# Patient Record
Sex: Male | Born: 1975 | Race: White | Hispanic: No | Marital: Married | Smoking: Current every day smoker
Health system: Southern US, Community
[De-identification: ages and names within clinical notes are randomized; demographics above are authoritative.]

## PROBLEM LIST (undated history)

## (undated) DIAGNOSIS — R0789 Other chest pain: Secondary | ICD-10-CM

## (undated) DIAGNOSIS — K4090 Unilateral inguinal hernia, without obstruction or gangrene, not specified as recurrent: Secondary | ICD-10-CM

## (undated) DIAGNOSIS — Z720 Tobacco use: Secondary | ICD-10-CM

## (undated) DIAGNOSIS — Z98811 Dental restoration status: Secondary | ICD-10-CM

## (undated) HISTORY — DX: Other chest pain: R07.89

## (undated) HISTORY — PX: WISDOM TOOTH EXTRACTION: SHX21

## (undated) HISTORY — DX: Tobacco use: Z72.0

---

## 2009-02-12 HISTORY — PX: HERNIA REPAIR: SHX51

## 2011-11-14 ENCOUNTER — Emergency Department (HOSPITAL_COMMUNITY)
Admission: EM | Admit: 2011-11-14 | Discharge: 2011-11-14 | Disposition: A | Discharge status: Home / Self Care | Payer: BC Managed Care – PPO | Attending: Emergency Medicine | Admitting: Emergency Medicine

## 2011-11-14 ENCOUNTER — Emergency Department (HOSPITAL_COMMUNITY): Payer: BC Managed Care – PPO

## 2011-11-14 ENCOUNTER — Encounter (HOSPITAL_COMMUNITY): Payer: Self-pay | Admitting: *Deleted

## 2011-11-14 DIAGNOSIS — F17200 Nicotine dependence, unspecified, uncomplicated: Secondary | ICD-10-CM | POA: Insufficient documentation

## 2011-11-14 DIAGNOSIS — K4090 Unilateral inguinal hernia, without obstruction or gangrene, not specified as recurrent: Secondary | ICD-10-CM | POA: Insufficient documentation

## 2011-11-14 DIAGNOSIS — R109 Unspecified abdominal pain: Secondary | ICD-10-CM | POA: Insufficient documentation

## 2011-11-14 LAB — COMPREHENSIVE METABOLIC PANEL
ALT: 36 U/L (ref 0–53)
AST: 24 U/L (ref 0–37)
Albumin: 4.2 g/dL (ref 3.5–5.2)
CO2: 26 mEq/L (ref 19–32)
Calcium: 9.4 mg/dL (ref 8.4–10.5)
Sodium: 140 mEq/L (ref 135–145)
Total Protein: 7.7 g/dL (ref 6.0–8.3)

## 2011-11-14 LAB — DIFFERENTIAL
Basophils Absolute: 0 10*3/uL (ref 0.0–0.1)
Basophils Relative: 1 % (ref 0–1)
Eosinophils Absolute: 0.5 10*3/uL (ref 0.0–0.7)
Eosinophils Relative: 8 % — ABNORMAL HIGH (ref 0–5)
Lymphocytes Relative: 36 % (ref 12–46)

## 2011-11-14 LAB — URINALYSIS, ROUTINE W REFLEX MICROSCOPIC
Glucose, UA: NEGATIVE mg/dL
Hgb urine dipstick: NEGATIVE
Leukocytes, UA: NEGATIVE
Specific Gravity, Urine: 1.026 (ref 1.005–1.030)

## 2011-11-14 LAB — CBC
MCV: 83.1 fL (ref 78.0–100.0)
Platelets: 279 10*3/uL (ref 150–400)
RDW: 11.6 % (ref 11.5–15.5)
WBC: 6 10*3/uL (ref 4.0–10.5)

## 2011-11-14 LAB — LACTIC ACID, PLASMA: Lactic Acid, Venous: 1.5 mmol/L (ref 0.5–2.2)

## 2011-11-14 MED ORDER — IOHEXOL 300 MG/ML  SOLN
100.0000 mL | Freq: Once | INTRAMUSCULAR | Status: AC | PRN
  Administered 2011-11-14: 100 mL via INTRAVENOUS

## 2011-11-14 NOTE — ED Provider Notes (Signed)
Medical screening examination/treatment/procedure(s) were conducted as a shared visit with non-physician practitioner(s) and myself.  I personally evaluated the patient during the encounter   Gary Octave, MD 11/14/11 2141

## 2011-11-14 NOTE — ED Provider Notes (Signed)
History     CSN: [id number]  Arrival date & time 11/14/11  1058   First MD Initiated Contact with Patient 11/14/11 1128      Chief Complaint  Patient presents with  . Abdominal Pain    (Consider location/radiation/quality/duration/timing/severity/associated sxs/prior treatment) HPI Comments: Intermittent right groin pain for several months, patient self diagnosed hernia. He has had hernia surgery in the left side. This morning the pain acutely worsened upon awaking from sleep. It is now improved. Still sore. Is a burning pain in his right inguinal region. He denies any nausea, vomiting, fever, testicular pain, back pain. No problems with urination. She's not had the right side of his groin evaluated.  The history is provided by the patient.    Past Medical History  Diagnosis Date  . Hernia     Past Surgical History  Procedure Date  . Hernia repair     History reviewed. No pertinent family history.  History  Substance Use Topics  . Smoking status: Current Everyday Smoker -- 1.0 packs/day    Types: Cigarettes  . Smokeless tobacco: Not on file  . Alcohol Use: No      Review of Systems  Constitutional: Negative for activity change and appetite change.  HENT: Negative for congestion and rhinorrhea.   Respiratory: Negative for cough and chest tightness.   Cardiovascular: Negative for chest pain.  Gastrointestinal: Positive for abdominal pain. Negative for nausea and vomiting.  Genitourinary: Negative for dysuria, hematuria and testicular pain.  Musculoskeletal: Negative for back pain.  Skin: Negative for rash.  Neurological: Negative for headaches.    Allergies  Review of patient's allergies indicates no known allergies.  Home Medications  No current outpatient prescriptions on file.  BP 141/83  Pulse 77  Temp(Src) 98 F (36.7 C) (Oral)  Resp 16  SpO2 98%  Physical Exam  Constitutional: He appears well-developed and well-nourished. No distress.  HENT:    Head: Normocephalic and atraumatic.  Mouth/Throat: Oropharynx is clear and moist. No oropharyngeal exudate.  Eyes: Conjunctivae and EOM are normal. Pupils are equal, round, and reactive to light.  Neck: Normal range of motion.  Cardiovascular: Normal rate, regular rhythm and normal heart sounds.   Pulmonary/Chest: Effort normal and breath sounds normal. No respiratory distress.  Abdominal: Soft. There is no tenderness. There is no rebound and no guarding.       No abdominal tenderness no pain at McBurney's point. There is fullness and tenderness in the right inguinal area. No appreciable hernia  Genitourinary:       Testicles descended bilaterally, no tenderness.  Musculoskeletal: Normal range of motion. He exhibits no edema and no tenderness.  Neurological: He is alert.  Skin: Skin is warm.    ED Course  Procedures (including critical care time)  Labs Reviewed  CBC - Abnormal; Notable for the following:    Hemoglobin 17.2 (*)    MCHC 36.4 (*)    All other components within normal limits  DIFFERENTIAL - Abnormal; Notable for the following:    Eosinophils Relative 8 (*)    All other components within normal limits  COMPREHENSIVE METABOLIC PANEL - Abnormal; Notable for the following:    Glucose, Bld 109 (*)    All other components within normal limits  URINALYSIS, ROUTINE W REFLEX MICROSCOPIC - Abnormal; Notable for the following:    Bilirubin Urine SMALL (*)    Ketones, ur 15 (*)    All other components within normal limits  LIPASE, BLOOD  LACTIC ACID, PLASMA  Ct Abdomen Pelvis W Contrast  11/14/2011  *RADIOLOGY REPORT*  Clinical Data: Right inguinal pain.  Right inguinal hernia.  CT ABDOMEN AND PELVIS WITH CONTRAST  Technique:  Multidetector CT imaging of the abdomen and pelvis was performed following the standard protocol during bolus administration of intravenous contrast.  Contrast: OMNIPAQUE IOHEXOL 300 MG/ML IV SOLN  Comparison: None.  Findings: Lung bases show  atelectasis.  Liver and gallbladder appear within normal limits.  No calcified gallstone.  Common bile duct and pancreas appear normal.  The spleen is normal.  Dense bilateral adrenal calcification is present, likely resulting from old infection.  Kidneys show normal enhancement.  Small bowel is normal.  No adenopathy.  No small bowel obstruction.  Stomach appears normal.  Normal appendix.  The colon is within normal limits.  Urinary bladder normal.  There is a large fat containing right inguinal hernia with fat extending into the right scrotum.  No left inguinal hernia is present.  The bones appear within normal limits.  IMPRESSION: Large fat containing right inguinal hernia.  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  Original Report Authenticated By: Andreas Newport, M.D.     1. Right inguinal hernia       MDM  Inguinal pain with likely inguinal hernia. No evidence of incarceration or stangulation.  Will CT in CDU to delineate anatomy.        Glynn Octave, MD 11/14/11 [phone]

## 2011-11-14 NOTE — ED Notes (Signed)
Reports onset of RLQ pain, pt states that he has a hernia.

## 2011-11-14 NOTE — ED Notes (Signed)
Patient reports onset of pain in his right groin area after stretching.  He states the area is burning and painful.  Worse when standing upright.  He states he has been able to urinate w/o difficutly.   Patient has hx of hernia on the left side with surgical repair

## 2011-11-14 NOTE — ED Notes (Signed)
Ct called and kelly is aware pt has finished his po ct prep

## 2011-11-14 NOTE — ED Provider Notes (Signed)
1:30 PM Pt with known right inguinal hernia. Sudden onset of pain early this morning. States now pain resolved. Pt awaiting for CT abd/pelvis to rule out incarcerated hernia. Pt in NAD. Regular HR and rhythm. Lungs clear to auscultation bilaterally. Abdomen non tender. Right inguinal hernia present, soft.   2:32 PM CT negative, other then large fat containing hernia. Pt continues to be symptom free. Will d/c home with surgery follow up. Instructed to return if pain comes back and is not improving, or if develop fever, nausea, vomiting.     Lottie Mussel, PA 11/14/11 1435

## 2011-11-14 NOTE — ED Notes (Signed)
Pt has returned from ct 

## 2012-02-26 ENCOUNTER — Encounter (INDEPENDENT_AMBULATORY_CARE_PROVIDER_SITE_OTHER): Payer: Self-pay | Admitting: Surgery

## 2012-02-28 ENCOUNTER — Encounter (INDEPENDENT_AMBULATORY_CARE_PROVIDER_SITE_OTHER): Payer: Self-pay | Admitting: Surgery

## 2012-03-20 ENCOUNTER — Encounter (INDEPENDENT_AMBULATORY_CARE_PROVIDER_SITE_OTHER): Payer: Self-pay | Admitting: Surgery

## 2012-04-14 DIAGNOSIS — K4090 Unilateral inguinal hernia, without obstruction or gangrene, not specified as recurrent: Secondary | ICD-10-CM

## 2012-04-14 HISTORY — DX: Unilateral inguinal hernia, without obstruction or gangrene, not specified as recurrent: K40.90

## 2012-04-15 ENCOUNTER — Encounter (INDEPENDENT_AMBULATORY_CARE_PROVIDER_SITE_OTHER): Payer: Self-pay | Admitting: Surgery

## 2012-04-15 ENCOUNTER — Ambulatory Visit (INDEPENDENT_AMBULATORY_CARE_PROVIDER_SITE_OTHER): Payer: BC Managed Care – PPO | Admitting: Surgery

## 2012-04-15 VITALS — BP 126/84 | HR 68 | Temp 97.8°F | Ht 70.0 in | Wt 174.4 lb

## 2012-04-15 DIAGNOSIS — K4090 Unilateral inguinal hernia, without obstruction or gangrene, not specified as recurrent: Secondary | ICD-10-CM

## 2012-04-15 NOTE — Progress Notes (Signed)
Patient ID: Gary Hansen, male   DOB: 08-01-1976, 36 y.o.   MRN: [id number]  Chief Complaint  Patient presents with  . Pre-op Exam    eval RIH    HPISelf-referred for right inguinal hernia Gary Hansen is a 36 y.o. male.   HPI This is a 36 year old male who is 3 years status post open repair of a large indirect left inguinal hernia with mesh. The patient has been doing well. Several months ago he was lifting something heavy at home when he felt a pulling burning sensation in his right groin. He has been able to work. The bulge has gotten quite large and extends down into the scrotum. He has no symptoms on the left side. He states that the only time he feels pain is in the morning when he first gets up and stretches. Otherwise he reports no significant discomfort. He continues to work at [employer] but his current position does not involve lifting packages. Past Medical History  Diagnosis Date  . Hernia   [facility] 02/2009   Past Surgical History  Procedure Date  . Hernia repair   . Wisdom tooth extraction     History reviewed. No pertinent family history.  Social History History  Substance Use Topics  . Smoking status: Current Everyday Smoker -- 1.0 packs/day    Types: Cigarettes  . Smokeless tobacco: Not on file  . Alcohol Use: No    No Known Allergies  No current outpatient prescriptions on file.    Review of Systems Review of Systems  Constitutional: Negative for fever, chills and unexpected weight change.  HENT: Negative for hearing loss, congestion, sore throat, trouble swallowing and voice change.   Eyes: Negative for visual disturbance.  Respiratory: Negative for cough and wheezing.   Cardiovascular: Negative for chest pain, palpitations and leg swelling.  Gastrointestinal: Negative for nausea, vomiting, abdominal pain, diarrhea, constipation, blood in stool, abdominal distention, anal bleeding and rectal pain.  Genitourinary: Positive for scrotal swelling. Negative for  hematuria and difficulty urinating.  Musculoskeletal: Negative for arthralgias.  Skin: Negative for rash and wound.  Neurological: Negative for seizures, syncope, weakness and headaches.  Hematological: Negative for adenopathy. Does not bruise/bleed easily.  Psychiatric/Behavioral: Negative for confusion.    Blood pressure 126/84, pulse 68, temperature 97.8 F (36.6 C), temperature source Temporal, height 5\' 10"  (1.778 m), weight 174 lb 6.4 oz (79.107 kg), SpO2 98.00%.  Physical Exam Physical Exam WDWN in NAD HEENT:  EOMI, sclera anicteric Neck:  No masses, no thyromegaly Lungs:  CTA bilaterally; normal respiratory effort CV:  Regular rate and rhythm; no murmurs Abd:  +bowel sounds, soft, non-tender, no masses GU:  Bilateral descended testes; large right inguinal bulge extending down into the right scrotum;  Partially reducible when supine; no sign of left inguinal hernia Ext:  Well-perfused; no edema Skin:  Warm, dry; no sign of jaundice  Data Reviewed none  Assessment    Large partially reducible right inguinal hernia     Plan    Right inguinal hernia repair with mesh.  The surgical procedure has been discussed with the patient.  Potential risks, benefits, alternative treatments, and expected outcomes have been explained.  All of the patient's questions at this time have been answered.  The likelihood of reaching the patient's treatment goal is good.  The patient understand the proposed surgical procedure and wishes to proceed.        Gary Walder K. 04/15/2012, 3:46 PM

## 2012-05-12 ENCOUNTER — Encounter (HOSPITAL_BASED_OUTPATIENT_CLINIC_OR_DEPARTMENT_OTHER): Payer: Self-pay | Admitting: *Deleted

## 2012-05-13 ENCOUNTER — Encounter (HOSPITAL_BASED_OUTPATIENT_CLINIC_OR_DEPARTMENT_OTHER): Payer: Self-pay | Admitting: *Deleted

## 2012-05-18 NOTE — H&P (Signed)
Chief Complaint   Patient presents with   .  Pre-op Exam       eval RIH        HPISelf-referred for right inguinal hernia Gary Hansen is a 36 y.o. male.   HPI This is a 36 year old male who is 3 years status post open repair of a large indirect left inguinal hernia with mesh. The patient has been doing well. Several months ago he was lifting something heavy at home when he felt a pulling burning sensation in his right groin. He has been able to work. The bulge has gotten quite large and extends down into the scrotum. He has no symptoms on the left side. He states that the only time he feels pain is in the morning when he first gets up and stretches. Otherwise he reports no significant discomfort. He continues to work at [employer] but his current position does not involve lifting packages. Past Medical History   Diagnosis  Date   .  Hernia      [facility] 02/2009           Past Surgical History   Procedure  Date   .  Hernia repair     .  Wisdom tooth extraction          History reviewed. No pertinent family history.   Social History History   Substance Use Topics   .  Smoking status:  Current Everyday Smoker -- 1.0 packs/day       Types:  Cigarettes   .  Smokeless tobacco:  Not on file   .  Alcohol Use:  No        No Known Allergies    No current outpatient prescriptions on file.        Review of Systems Review of Systems  Constitutional: Negative for fever, chills and unexpected weight change.  HENT: Negative for hearing loss, congestion, sore throat, trouble swallowing and voice change.   Eyes: Negative for visual disturbance.  Respiratory: Negative for cough and wheezing.   Cardiovascular: Negative for chest pain, palpitations and leg swelling.  Gastrointestinal: Negative for nausea, vomiting, abdominal pain, diarrhea, constipation, blood in stool, abdominal distention, anal bleeding and rectal pain.  Genitourinary: Positive for scrotal swelling. Negative  for hematuria and difficulty urinating.  Musculoskeletal: Negative for arthralgias.  Skin: Negative for rash and wound.  Neurological: Negative for seizures, syncope, weakness and headaches.  Hematological: Negative for adenopathy. Does not bruise/bleed easily.  Psychiatric/Behavioral: Negative for confusion.      Blood pressure 126/84, pulse 68, temperature 97.8 F (36.6 C), temperature source Temporal, height 5\' 10"  (1.778 m), weight 174 lb 6.4 oz (79.107 kg), SpO2 98.00%.   Physical Exam Physical Exam WDWN in NAD HEENT:  EOMI, sclera anicteric Neck:  No masses, no thyromegaly Lungs:  CTA bilaterally; normal respiratory effort CV:  Regular rate and rhythm; no murmurs Abd:  +bowel sounds, soft, non-tender, no masses GU:  Bilateral descended testes; large right inguinal bulge extending down into the right scrotum;  Partially reducible when supine; no sign of left inguinal hernia Ext:  Well-perfused; no edema Skin:  Warm, dry; no sign of jaundice   Data Reviewed none   Assessment    Large partially reducible right inguinal hernia      Plan    Right inguinal hernia repair with mesh.  The surgical procedure has been discussed with the patient.  Potential risks, benefits, alternative treatments, and expected outcomes have been explained.  All of the patient's questions at this time have been answered.  The likelihood of reaching the patient's treatment goal is good.  The patient understand the proposed surgical procedure and wishes to proceed.         Wilmon Arms. Corliss Skains, MD, [facility]  05/18/2012 7:03 PM

## 2012-05-19 ENCOUNTER — Ambulatory Visit (HOSPITAL_BASED_OUTPATIENT_CLINIC_OR_DEPARTMENT_OTHER)
Admission: RE | Admit: 2012-05-19 | Discharge: 2012-05-19 | Disposition: A | Discharge status: Home / Self Care | Payer: BC Managed Care – PPO | Source: Ambulatory Visit | Attending: Surgery | Admitting: Surgery

## 2012-05-19 ENCOUNTER — Encounter (HOSPITAL_BASED_OUTPATIENT_CLINIC_OR_DEPARTMENT_OTHER)
Admission: RE | Disposition: A | Discharge status: Home / Self Care | Payer: Self-pay | Source: Ambulatory Visit | Attending: Surgery

## 2012-05-19 ENCOUNTER — Telehealth (INDEPENDENT_AMBULATORY_CARE_PROVIDER_SITE_OTHER): Payer: Self-pay | Admitting: General Surgery

## 2012-05-19 ENCOUNTER — Encounter (HOSPITAL_BASED_OUTPATIENT_CLINIC_OR_DEPARTMENT_OTHER): Payer: Self-pay

## 2012-05-19 ENCOUNTER — Encounter (HOSPITAL_BASED_OUTPATIENT_CLINIC_OR_DEPARTMENT_OTHER): Payer: Self-pay | Admitting: Anesthesiology

## 2012-05-19 ENCOUNTER — Ambulatory Visit (HOSPITAL_BASED_OUTPATIENT_CLINIC_OR_DEPARTMENT_OTHER): Payer: BC Managed Care – PPO | Admitting: Anesthesiology

## 2012-05-19 DIAGNOSIS — F17200 Nicotine dependence, unspecified, uncomplicated: Secondary | ICD-10-CM | POA: Insufficient documentation

## 2012-05-19 DIAGNOSIS — K4090 Unilateral inguinal hernia, without obstruction or gangrene, not specified as recurrent: Secondary | ICD-10-CM | POA: Insufficient documentation

## 2012-05-19 HISTORY — DX: Unilateral inguinal hernia, without obstruction or gangrene, not specified as recurrent: K40.90

## 2012-05-19 HISTORY — PX: HERNIA REPAIR: SHX51

## 2012-05-19 HISTORY — DX: Dental restoration status: Z98.811

## 2012-05-19 HISTORY — PX: INGUINAL HERNIA REPAIR: SHX194

## 2012-05-19 LAB — POCT HEMOGLOBIN-HEMACUE: Hemoglobin: 18.9 g/dL — ABNORMAL HIGH (ref 13.0–17.0)

## 2012-05-19 SURGERY — REPAIR, HERNIA, INGUINAL, ADULT
Anesthesia: General | Site: Groin | Laterality: Right | Wound class: Clean

## 2012-05-19 MED ORDER — KETOROLAC TROMETHAMINE 30 MG/ML IJ SOLN
INTRAMUSCULAR | Status: DC | PRN
  Administered 2012-05-19: 30 mg via INTRAVENOUS

## 2012-05-19 MED ORDER — PROPOFOL 10 MG/ML IV EMUL
INTRAVENOUS | Status: DC | PRN
  Administered 2012-05-19: 250 mg via INTRAVENOUS

## 2012-05-19 MED ORDER — DEXAMETHASONE SODIUM PHOSPHATE 4 MG/ML IJ SOLN
INTRAMUSCULAR | Status: DC | PRN
  Administered 2012-05-19: 10 mg via INTRAVENOUS

## 2012-05-19 MED ORDER — LIDOCAINE HCL (CARDIAC) 20 MG/ML IV SOLN
INTRAVENOUS | Status: DC | PRN
  Administered 2012-05-19: 50 mg via INTRAVENOUS

## 2012-05-19 MED ORDER — CHLORHEXIDINE GLUCONATE 4 % EX LIQD: 1.0000 "application " | Freq: Once | CUTANEOUS | Status: DC

## 2012-05-19 MED ORDER — FENTANYL CITRATE 0.05 MG/ML IJ SOLN
50.0000 ug | INTRAMUSCULAR | Status: DC | PRN
  Administered 2012-05-19: 100 ug via INTRAVENOUS

## 2012-05-19 MED ORDER — GLYCOPYRROLATE 0.2 MG/ML IJ SOLN
0.1000 mg | Freq: Once | INTRAMUSCULAR | Status: AC
  Administered 2012-05-19: 0.2 mg via INTRAVENOUS

## 2012-05-19 MED ORDER — OXYCODONE HCL 5 MG PO TABS
5.0000 mg | ORAL_TABLET | Freq: Once | ORAL | Status: AC | PRN
  Administered 2012-05-19: 5 mg via ORAL

## 2012-05-19 MED ORDER — OXYCODONE HCL 5 MG/5ML PO SOLN: 5.0000 mg | Freq: Once | ORAL | Status: AC | PRN

## 2012-05-19 MED ORDER — MIDAZOLAM HCL 2 MG/2ML IJ SOLN
0.5000 mg | Freq: Once | INTRAMUSCULAR | Status: AC | PRN
  Administered 2012-05-19: 0.5 mg via INTRAVENOUS

## 2012-05-19 MED ORDER — LACTATED RINGERS IV SOLN
INTRAVENOUS | Status: DC
  Administered 2012-05-19 (×3): via INTRAVENOUS

## 2012-05-19 MED ORDER — MIDAZOLAM HCL 2 MG/2ML IJ SOLN
1.0000 mg | INTRAMUSCULAR | Status: DC | PRN
  Administered 2012-05-19: 2 mg via INTRAVENOUS

## 2012-05-19 MED ORDER — METOCLOPRAMIDE HCL 5 MG/ML IJ SOLN: 10.0000 mg | Freq: Once | INTRAMUSCULAR | Status: DC | PRN

## 2012-05-19 MED ORDER — BUPIVACAINE HCL (PF) 0.5 % IJ SOLN
INTRAMUSCULAR | Status: DC | PRN
  Administered 2012-05-19: 20 mL

## 2012-05-19 MED ORDER — BUPIVACAINE-EPINEPHRINE 0.25% -1:200000 IJ SOLN
INTRAMUSCULAR | Status: DC | PRN
  Administered 2012-05-19: 12 mL

## 2012-05-19 MED ORDER — OXYCODONE-ACETAMINOPHEN 5-325 MG PO TABS: 1.0000 | ORAL_TABLET | ORAL | Status: AC | PRN

## 2012-05-19 MED ORDER — ONDANSETRON HCL 4 MG/2ML IJ SOLN: 4.0000 mg | INTRAMUSCULAR | Status: DC | PRN

## 2012-05-19 MED ORDER — HYDROMORPHONE HCL PF 1 MG/ML IJ SOLN
0.2500 mg | INTRAMUSCULAR | Status: DC | PRN
  Administered 2012-05-19 (×2): 0.25 mg via INTRAVENOUS

## 2012-05-19 MED ORDER — CEFAZOLIN SODIUM-DEXTROSE 2-3 GM-% IV SOLR
2.0000 g | INTRAVENOUS | Status: AC
  Administered 2012-05-19: 2 g via INTRAVENOUS

## 2012-05-19 MED ORDER — OXYCODONE-ACETAMINOPHEN 5-325 MG PO TABS: 1.0000 | ORAL_TABLET | ORAL | Status: DC | PRN

## 2012-05-19 MED ORDER — MORPHINE SULFATE 2 MG/ML IJ SOLN: 2.0000 mg | INTRAMUSCULAR | Status: DC | PRN

## 2012-05-19 MED ORDER — ACETAMINOPHEN 10 MG/ML IV SOLN
1000.0000 mg | Freq: Once | INTRAVENOUS | Status: AC
  Administered 2012-05-19: 1000 mg via INTRAVENOUS

## 2012-05-19 MED ORDER — SCOPOLAMINE 1 MG/3DAYS TD PT72
1.0000 | MEDICATED_PATCH | Freq: Once | TRANSDERMAL | Status: DC
  Administered 2012-05-19: 1.5 mg via TRANSDERMAL

## 2012-05-19 MED ORDER — METOCLOPRAMIDE HCL 5 MG/ML IJ SOLN
INTRAMUSCULAR | Status: DC | PRN
  Administered 2012-05-19: 10 mg via INTRAVENOUS

## 2012-05-19 SURGICAL SUPPLY — 52 items
BENZOIN TINCTURE PRP APPL 2/3 (GAUZE/BANDAGES/DRESSINGS) ×2 IMPLANT
BLADE HEX COATED 2.75 (ELECTRODE) IMPLANT
BLADE SURG 15 STRL LF DISP TIS (BLADE) ×1 IMPLANT
BLADE SURG 15 STRL SS (BLADE) ×1
BLADE SURG ROTATE 9660 (MISCELLANEOUS) ×2 IMPLANT
CANISTER SUCTION 1200CC (MISCELLANEOUS) IMPLANT
CHLORAPREP W/TINT 26ML (MISCELLANEOUS) ×2 IMPLANT
CLOTH BEACON ORANGE TIMEOUT ST (SAFETY) ×2 IMPLANT
COVER MAYO STAND STRL (DRAPES) ×2 IMPLANT
COVER TABLE BACK 60X90 (DRAPES) ×2 IMPLANT
DECANTER SPIKE VIAL GLASS SM (MISCELLANEOUS) IMPLANT
DRAIN PENROSE 1/2X12 LTX STRL (WOUND CARE) ×2 IMPLANT
DRAPE LAPAROTOMY TRNSV 102X78 (DRAPE) ×2 IMPLANT
DRAPE UTILITY XL STRL (DRAPES) ×2 IMPLANT
DRSG TEGADERM 4X4.75 (GAUZE/BANDAGES/DRESSINGS) ×2 IMPLANT
ELECT REM PT RETURN 9FT ADLT (ELECTROSURGICAL) ×2
ELECTRODE REM PT RTRN 9FT ADLT (ELECTROSURGICAL) ×1 IMPLANT
GAUZE SPONGE 4X4 12PLY STRL LF (GAUZE/BANDAGES/DRESSINGS) ×2 IMPLANT
GLOVE BIO SURGEON STRL SZ7 (GLOVE) ×4 IMPLANT
GLOVE BIOGEL PI IND STRL 7.5 (GLOVE) ×1 IMPLANT
GLOVE BIOGEL PI INDICATOR 7.5 (GLOVE) ×1
GOWN PREVENTION PLUS XLARGE (GOWN DISPOSABLE) ×4 IMPLANT
MESH ULTRAPRO 3X6 7.6X15CM (Mesh General) ×2 IMPLANT
NEEDLE HYPO 25X1 1.5 SAFETY (NEEDLE) ×2 IMPLANT
NS IRRIG 1000ML POUR BTL (IV SOLUTION) IMPLANT
PACK BASIN DAY SURGERY FS (CUSTOM PROCEDURE TRAY) ×2 IMPLANT
PAIN PUMP ON-Q 100MLX2ML 2.5IN (PAIN MANAGEMENT) IMPLANT
PENCIL BUTTON HOLSTER BLD 10FT (ELECTRODE) ×2 IMPLANT
SLEEVE SCD COMPRESS KNEE MED (MISCELLANEOUS) ×2 IMPLANT
SPONGE GAUZE 4X4 12PLY (GAUZE/BANDAGES/DRESSINGS) ×2 IMPLANT
SPONGE INTESTINAL PEANUT (DISPOSABLE) ×2 IMPLANT
SPONGE LAP 18X18 X RAY DECT (DISPOSABLE) IMPLANT
SPONGE LAP 4X18 X RAY DECT (DISPOSABLE) ×2 IMPLANT
STRIP CLOSURE SKIN 1/2X4 (GAUZE/BANDAGES/DRESSINGS) ×2 IMPLANT
SUT MON AB 4-0 PC3 18 (SUTURE) ×2 IMPLANT
SUT PDS AB 0 CT 36 (SUTURE) IMPLANT
SUT PROLENE 2 0 SH DA (SUTURE) ×4 IMPLANT
SUT SILK 2 0 SH (SUTURE) ×2 IMPLANT
SUT SILK 3 0 SH 30 (SUTURE) IMPLANT
SUT SILK 3 0 TIES 17X18 (SUTURE)
SUT SILK 3-0 18XBRD TIE BLK (SUTURE) IMPLANT
SUT VIC AB 0 SH 27 (SUTURE) ×2 IMPLANT
SUT VIC AB 2-0 SH 27 (SUTURE) ×2
SUT VIC AB 2-0 SH 27XBRD (SUTURE) ×2 IMPLANT
SUT VIC AB 3-0 SH 27 (SUTURE) ×1
SUT VIC AB 3-0 SH 27X BRD (SUTURE) ×1 IMPLANT
SYR CONTROL 10ML LL (SYRINGE) ×2 IMPLANT
TOWEL OR 17X24 6PK STRL BLUE (TOWEL DISPOSABLE) ×2 IMPLANT
TOWEL OR NON WOVEN STRL DISP B (DISPOSABLE) ×2 IMPLANT
TUBE CONNECTING 20X1/4 (TUBING) IMPLANT
WATER STERILE IRR 1000ML POUR (IV SOLUTION) IMPLANT
YANKAUER SUCT BULB TIP NO VENT (SUCTIONS) IMPLANT

## 2012-05-19 NOTE — Anesthesia Preprocedure Evaluation (Addendum)
Anesthesia Evaluation  Patient identified by MRN, date of birth, ID band Patient awake    Reviewed: Allergy & Precautions, H&P , NPO status , Patient's Chart, lab work & pertinent test results, reviewed documented beta blocker date and time   Airway Mallampati: II TM Distance: >3 FB Neck ROM: full    Dental   Pulmonary neg pulmonary ROS, Current Smoker,  breath sounds clear to auscultation        Cardiovascular negative cardio ROS  Rhythm:regular     Neuro/Psych negative neurological ROS  negative psych ROS   GI/Hepatic negative GI ROS, Neg liver ROS,   Endo/Other  negative endocrine ROS  Renal/GU negative Renal ROS  negative genitourinary   Musculoskeletal   Abdominal   Peds  Hematology negative hematology ROS (+)   Anesthesia Other Findings See surgeon's H&P   Reproductive/Obstetrics negative OB ROS                          Anesthesia Physical Anesthesia Plan  ASA: II  Anesthesia Plan: General   Post-op Pain Management:    Induction: Intravenous  Airway Management Planned: LMA  Additional Equipment:   Intra-op Plan:   Post-operative Plan: Extubation in OR  Informed Consent: I have reviewed the patients History and Physical, chart, labs and discussed the procedure including the risks, benefits and alternatives for the proposed anesthesia with the patient or authorized representative who has indicated his/her understanding and acceptance.   Dental Advisory Given  Plan Discussed with: CRNA and Surgeon  Anesthesia Plan Comments:        Anesthesia Quick Evaluation

## 2012-05-19 NOTE — Telephone Encounter (Signed)
He had a right inguinal hernia repair today by Dr. Corliss Skains and stated he had some blood on his bandage. When I questioned him, there did not appear to be evidence of active bleeding. He had not been putting any ice on the area. I instructed him to go recline and place some ice on the area and if he had significant bleeding to call back.

## 2012-05-19 NOTE — Anesthesia Postprocedure Evaluation (Signed)
Anesthesia Post Note  Patient: Gary Hansen  Procedure(s) Performed: Procedure(s) (LRB): HERNIA REPAIR INGUINAL ADULT (Right) INSERTION OF MESH (Right)  Anesthesia type: MAC  Patient location: PACU  Post pain: Pain level controlled  Post assessment: Patient's Cardiovascular Status Stable  Last Vitals:  Filed Vitals:   05/19/12 1445  BP: 125/76  Pulse: 46  Temp:   Resp: 9    Post vital signs: Reviewed and stable  Level of consciousness: alert  Complications: No apparent anesthesia complications

## 2012-05-19 NOTE — Progress Notes (Signed)
Assisted Dr. Frederick with right, ultrasound guided, transabdominal plane block. Side rails up, monitors on throughout procedure. See vital signs in flow sheet. Tolerated Procedure well. 

## 2012-05-19 NOTE — Transfer of Care (Signed)
Immediate Anesthesia Transfer of Care Note  Patient: Gary Hansen  Procedure(s) Performed: Procedure(s) (LRB): HERNIA REPAIR INGUINAL ADULT (Right) INSERTION OF MESH (Right)  Patient Location: PACU  Anesthesia Type: General and Regional  Level of Consciousness: awake  Airway & Oxygen Therapy: Patient Spontanous Breathing and Patient connected to face mask oxygen  Post-op Assessment: Report given to PACU RN and Post -op Vital signs reviewed and stable  Post vital signs: Reviewed and stable  Complications: No apparent anesthesia complications

## 2012-05-19 NOTE — Interval H&P Note (Signed)
History and Physical Interval Note:  05/19/2012 10:01 AM  Gary Hansen  has presented today for surgery, with the diagnosis of right inguinal hernia  The various methods of treatment have been discussed with the patient and family. After consideration of risks, benefits and other options for treatment, the patient has consented to  Procedure(s) (LRB): HERNIA REPAIR INGUINAL ADULT (Right) INSERTION OF MESH (Right) as a surgical intervention .  The patient's history has been reviewed, patient examined, no change in status, stable for surgery.  I have reviewed the patient's chart and labs.  Questions were answered to the patient's satisfaction.     Verlene Glantz K.

## 2012-05-19 NOTE — Op Note (Signed)
Hernia, Open, Procedure Note  Indications: The patient presented with a history of a right, reducible inguinal hernia.    Pre-operative Diagnosis: right reducible inguinal hernia Post-operative Diagnosis: same  Surgeon: Wynona Luna.   Assistants: none  Anesthesia: General LMA anesthesia and TAP block  ASA Class: 2  Procedure Details  The patient was seen again in the Holding Room. The risks, benefits, complications, treatment options, and expected outcomes were discussed with the patient. The possibilities of reaction to medication, pulmonary aspiration, perforation of viscus, bleeding, recurrent infection, the need for additional procedures, and development of a complication requiring transfusion or further operation were discussed with the patient and/or family. The likelihood of success in repairing the hernia and returning the patient to their previous functional status is good.  There was concurrence with the proposed plan, and informed consent was obtained. The site of surgery was properly noted/marked. The patient was taken to the Operating Room, identified as Althea Charon, and the procedure verified as right inguinal hernia repair. A Time Out was held and the above information confirmed.  The patient was placed in the supine position and underwent induction of anesthesia. The lower abdomen and groin was prepped with Chloraprep and draped in the standard fashion, and 0.5% Marcaine with epinephrine was used to anesthetize the skin over the mid-portion of the inguinal canal. An oblique incision was made. Dissection was carried down through the subcutaneous tissue with cautery to the external oblique fascia.  We opened the external oblique fascia along the direction of its fibers to the external ring.  The spermatic cord was circumferentially dissected bluntly and retracted with a Penrose drain.  The floor of the inguinal canal was inspected and revealed some laxity, but no direct defect.   We skeletonized the spermatic cord and identified a very large indirect hernia sac running down to the testicle.  I opened the sac and reduced a large amount of omentum.  The sac was ligated with 2-0 silk suture and reduced.  The internal ring was tightened with 2-0 Vicryl.  The floor of the inguinal hernia was reinforced with 0 vicryl.  We used a 3 x 6 inch piece of Ultrapro mesh, which was cut into a keyhole shape.  This was secured with 2-0 Prolene, beginning at the pubic tubercle, running this along the internal oblique fascia superiorly and the shelving edge inferiorly.  The tails of the mesh were sutured together behind the spermatic cord.  The mesh was tucked underneath the external oblique fascia laterally.  The external oblique fascia was reapproximated with 2-0 Vicryl.  3-0 Vicryl was used to close the subcutaneous tissues and 4-0 Monocryl was used to close the skin in subcuticular fashion.  Benzoin and steri-strips were used to seal the incision.  A clean dressing was applied.  The patient was then extubated and brought to the recovery room in stable condition.  All sponge, instrument, and needle counts were correct prior to closure and at the conclusion of the case.   Estimated Blood Loss: Minimal                 Complications: None; patient tolerated the procedure well.         Disposition: PACU - hemodynamically stable.         Condition: stable  Wilmon Arms. Corliss Skains, MD, [facility] Surgery  05/19/2012 1:28 PM

## 2012-05-19 NOTE — Anesthesia Procedure Notes (Signed)
Anesthesia Regional Block:  TAP block  Pre-Anesthetic Checklist: ,, timeout performed, Correct Patient, Correct Site, Correct Laterality, Correct Procedure, Correct Position, site marked, Risks and benefits discussed,  Surgical consent,  Pre-op evaluation,  At surgeon's request and post-op pain management  Laterality: Right  Prep: chloraprep       Needles:  Injection technique: Single-shot  Needle Type: Echogenic Needle     Needle Length: 9cm  Needle Gauge: 21    Additional Needles:  Procedures: ultrasound guided TAP block Narrative:  Start time: 05/19/2012 10:50 AM End time: 05/19/2012 10:57 AM Injection made incrementally with aspirations every 5 mL.  Performed by: Personally  Anesthesiologist: Aldona Lento, MD  Additional Notes: Ultrasound guidance used to: id relevant anatomy, confirm needle position, local anesthetic spread, avoidance of vascular puncture. Picture saved. No complications. Block performed personally by Janetta Hora. Gelene Mink, MD    TAP block

## 2012-05-20 ENCOUNTER — Encounter (HOSPITAL_BASED_OUTPATIENT_CLINIC_OR_DEPARTMENT_OTHER): Payer: Self-pay | Admitting: Surgery

## 2012-06-05 ENCOUNTER — Encounter (INDEPENDENT_AMBULATORY_CARE_PROVIDER_SITE_OTHER): Payer: Self-pay | Admitting: Surgery

## 2012-06-05 ENCOUNTER — Ambulatory Visit (INDEPENDENT_AMBULATORY_CARE_PROVIDER_SITE_OTHER): Payer: BC Managed Care – PPO | Admitting: Surgery

## 2012-06-05 VITALS — BP 104/70 | HR 56 | Temp 97.7°F | Ht 70.0 in | Wt 170.4 lb

## 2012-06-05 DIAGNOSIS — K4090 Unilateral inguinal hernia, without obstruction or gangrene, not specified as recurrent: Secondary | ICD-10-CM

## 2012-06-05 NOTE — Progress Notes (Signed)
Status post open repair of a large indirect right inguinal hernia on 05/19/12. The patient states that this hernia was more comfortable than the previous one. The discomfort and swelling are improving. Appetite and bowel movements are normal. His incision is well healed with no sign of infection. He does have some swelling under his incision but no sign of recurrent hernia. His job and brought some lifting so we will keep him out of work a full 6 weeks. He may return to work on 06/30/12. Followup p.r.n.  Wilmon Arms. Corliss Skains, MD, [facility]  06/05/2012 5:09 PM

## 2012-10-20 ENCOUNTER — Ambulatory Visit (INDEPENDENT_AMBULATORY_CARE_PROVIDER_SITE_OTHER): Payer: BC Managed Care – PPO | Admitting: Family Medicine

## 2012-10-20 VITALS — BP 132/90 | HR 106 | Temp 98.2°F | Resp 18 | Ht 71.0 in | Wt 168.0 lb

## 2012-10-20 DIAGNOSIS — R509 Fever, unspecified: Secondary | ICD-10-CM

## 2012-10-20 DIAGNOSIS — J111 Influenza due to unidentified influenza virus with other respiratory manifestations: Secondary | ICD-10-CM

## 2012-10-20 DIAGNOSIS — R05 Cough: Secondary | ICD-10-CM

## 2012-10-20 DIAGNOSIS — R059 Cough, unspecified: Secondary | ICD-10-CM

## 2012-10-20 DIAGNOSIS — R110 Nausea: Secondary | ICD-10-CM

## 2012-10-20 LAB — POCT INFLUENZA A/B
Influenza A, POC: POSITIVE
Influenza B, POC: NEGATIVE

## 2012-10-20 MED ORDER — ONDANSETRON 4 MG PO TBDP: 4.0000 mg | ORAL_TABLET | Freq: Three times a day (TID) | ORAL | Status: DC | PRN

## 2012-10-20 MED ORDER — HYDROCODONE-HOMATROPINE 5-1.5 MG/5ML PO SYRP: 5.0000 mL | ORAL_SOLUTION | ORAL | Status: DC | PRN

## 2012-10-20 NOTE — Patient Instructions (Addendum)

## 2012-10-20 NOTE — Progress Notes (Signed)
Subjective: 3 days ago patient started getting sick with a little tickling in his ear or cough. Yesterday and the day before he started running fever are getting nauseous and coughing badly. Last night he had to just lie around. He felt very feverish and hot.  Objective: Did not get a flu shot. His family is not yet available. He works for [employer]. His TMs are normal. Throat clear. Neck supple without significant nodes. Chest has soft wheezing bilaterally. He does smoke. Heart regular without murmurs.  Assessment: Influenza Nausea Diarrhea Cough Cigarette smoker  Plan: Flu swab  Results for orders placed in visit on 10/20/12  POCT INFLUENZA A/B      Component Value Range   Influenza A, POC Positive     Influenza B, POC Negative

## 2015-07-26 ENCOUNTER — Ambulatory Visit (INDEPENDENT_AMBULATORY_CARE_PROVIDER_SITE_OTHER): Payer: Self-pay | Admitting: Internal Medicine

## 2015-07-26 VITALS — BP 120/70 | HR 88 | Temp 98.0°F | Resp 18 | Ht 71.0 in | Wt 157.4 lb

## 2015-07-26 DIAGNOSIS — J9801 Acute bronchospasm: Secondary | ICD-10-CM | POA: Diagnosis not present

## 2015-07-26 DIAGNOSIS — J200 Acute bronchitis due to Mycoplasma pneumoniae: Secondary | ICD-10-CM

## 2015-07-26 DIAGNOSIS — R0602 Shortness of breath: Secondary | ICD-10-CM | POA: Diagnosis not present

## 2015-07-26 DIAGNOSIS — F17218 Nicotine dependence, cigarettes, with other nicotine-induced disorders: Secondary | ICD-10-CM

## 2015-07-26 MED ORDER — HYDROCODONE-ACETAMINOPHEN 7.5-325 MG/15ML PO SOLN: 5.0000 mL | Freq: Four times a day (QID) | ORAL | Status: DC | PRN

## 2015-07-26 MED ORDER — AZITHROMYCIN 500 MG PO TABS: 500.0000 mg | ORAL_TABLET | Freq: Every day | ORAL | Status: DC

## 2015-07-26 MED ORDER — ALBUTEROL SULFATE HFA 108 (90 BASE) MCG/ACT IN AERS: 2.0000 | INHALATION_SPRAY | Freq: Four times a day (QID) | RESPIRATORY_TRACT | Status: DC | PRN

## 2015-07-26 NOTE — Progress Notes (Signed)
Patient ID: Gary Hansen, male   DOB: 06-Jul-1976, 39 y.o.   MRN: [id number]   07/26/2015 at 9:03 AM  Gary Hansen / DOB: [birth year] / MRN: [id number]  Problem list reviewed and updated by me where necessary.   SUBJECTIVE  Gary Hansen is a 39 y.o. ill appearing male presenting for the chief complaint of cough, chest congestion, head congestion. Hard to breath last nite, no hx of asthma. Never used an inhaler.Marland Kitchen   He is a smoker!   He  has a past medical history of Inguinal hernia (04/2012) and Dental crowns present.    Medications reviewed and updated by myself where necessary, and exist elsewhere in the encounter.   Gary Hansen has No Known Allergies. He  reports that he has been smoking Cigarettes.  He has a 5 pack-year smoking history. He has never used smokeless tobacco. He reports that he does not drink alcohol or use illicit drugs. He  has no sexual activity history on file. The patient  has past surgical history that includes Wisdom tooth extraction; Inguinal hernia repair (05/19/2012); Hernia repair (02/2009); and Hernia repair (05/19/12).  His family history is not on file.  Review of Systems  Constitutional: Positive for chills and malaise/fatigue. Negative for fever.  HENT: Positive for congestion.   Respiratory: Positive for cough and sputum production. Negative for shortness of breath.   Cardiovascular: Negative for chest pain.  Gastrointestinal: Negative for nausea.  Skin: Negative for rash.  Neurological: Negative for dizziness and headaches.    OBJECTIVE  His  height is  (1.803 m) and weight is 157 lb 6.4 oz (71.396 kg). His oral temperature is 98 F (36.7 C). His blood pressure is 120/70 and his pulse is 88. His respiration is 18 and oxygen saturation is 98%.  The patient's body mass index is 21.96 kg/(m^2).  Physical Exam  Constitutional: He is oriented to person, place, and time. He appears well-developed and well-nourished. No distress.  HENT:  Head:  Normocephalic.  Nose: Mucosal edema, rhinorrhea and sinus tenderness present. Right sinus exhibits no maxillary sinus tenderness and no frontal sinus tenderness. Left sinus exhibits no maxillary sinus tenderness and no frontal sinus tenderness.  Eyes: Conjunctivae and EOM are normal.  Respiratory: Effort normal. No tachypnea. No respiratory distress. He has decreased breath sounds. He has wheezes. He has rhonchi. He has no rales.  Neurological: He is alert and oriented to person, place, and time. He exhibits normal muscle tone. Coordination normal.  Psychiatric: He has a normal mood and affect.    No results found for this or any previous visit (from the past 24 hour(s)).  ASSESSMENT & PLAN  There are no diagnoses linked to this encounter.

## 2015-07-26 NOTE — Patient Instructions (Signed)
Acute Bronchitis Bronchitis is inflammation of the airways that extend from the windpipe into the lungs (bronchi). The inflammation often causes mucus to develop. This leads to a cough, which is the most common symptom of bronchitis.  In acute bronchitis, the condition usually develops suddenly and goes away over time, usually in a couple weeks. Smoking, allergies, and asthma can make bronchitis worse. Repeated episodes of bronchitis may cause further lung problems.  CAUSES Acute bronchitis is most often caused by the same virus that causes a cold. The virus can spread from person to person (contagious) through coughing, sneezing, and touching contaminated objects. SIGNS AND SYMPTOMS   Cough.   Fever.   Coughing up mucus.   Body aches.   Chest congestion.   Chills.   Shortness of breath.   Sore throat.  DIAGNOSIS  Acute bronchitis is usually diagnosed through a physical exam. Your health care provider will also ask you questions about your medical history. Tests, such as chest X-rays, are sometimes done to rule out other conditions.  TREATMENT  Acute bronchitis usually goes away in a couple weeks. Oftentimes, no medical treatment is necessary. Medicines are sometimes given for relief of fever or cough. Antibiotic medicines are usually not needed but may be prescribed in certain situations. In some cases, an inhaler may be recommended to help reduce shortness of breath and control the cough. A cool mist vaporizer may also be used to help thin bronchial secretions and make it easier to clear the chest.  HOME CARE INSTRUCTIONS  Get plenty of rest.   Drink enough fluids to keep your urine clear or pale yellow (unless you have a medical condition that requires fluid restriction). Increasing fluids may help thin your respiratory secretions (sputum) and reduce chest congestion, and it will prevent dehydration.   Take medicines only as directed by your health care provider.  If  you were prescribed an antibiotic medicine, finish it all even if you start to feel better.  Avoid smoking and secondhand smoke. Exposure to cigarette smoke or irritating chemicals will make bronchitis worse. If you are a smoker, consider using nicotine gum or skin patches to help control withdrawal symptoms. Quitting smoking will help your lungs heal faster.   Reduce the chances of another bout of acute bronchitis by washing your hands frequently, avoiding people with cold symptoms, and trying not to touch your hands to your mouth, nose, or eyes.   Keep all follow-up visits as directed by your health care provider.  SEEK MEDICAL CARE IF: Your symptoms do not improve after 1 week of treatment.  SEEK IMMEDIATE MEDICAL CARE IF:  You develop an increased fever or chills.   You have chest pain.   You have severe shortness of breath.  You have bloody sputum.   You develop dehydration.  You faint or repeatedly feel like you are going to pass out.  You develop repeated vomiting.  You develop a severe headache. MAKE SURE YOU:   Understand these instructions.  Will watch your condition.  Will get help right away if you are not doing well or get worse.   This information is not intended to replace advice given to you by your health care provider. Make sure you discuss any questions you have with your health care provider.   Document Released: 11/08/2004 Document Revised: 10/22/2014 Document Reviewed: 03/24/2013 Elsevier Interactive Patient Education 2016 [employer] Inc[employer] Bronchospasm, Adult A bronchospasm is a spasm or tightening of the airways going into the lungs. During a bronchospasm  breathing becomes more difficult because the airways get smaller. When this happens there can be coughing, a whistling sound when breathing (wheezing), and difficulty breathing. Bronchospasm is often associated with asthma, but not all patients who experience a bronchospasm have asthma. CAUSES  A  bronchospasm is caused by inflammation or irritation of the airways. The inflammation or irritation may be triggered by:   Allergies (such as to animals, pollen, food, or mold). Allergens that cause bronchospasm may cause wheezing immediately after exposure or many hours later.   Infection. Viral infections are believed to be the most common cause of bronchospasm.   Exercise.   Irritants (such as pollution, cigarette smoke, strong odors, aerosol sprays, and paint fumes).   Weather changes. Winds increase molds and pollens in the air. Rain refreshes the air by washing irritants out. Cold air may cause inflammation.   Stress and emotional upset.  SIGNS AND SYMPTOMS   Wheezing.   Excessive nighttime coughing.   Frequent or severe coughing with a simple cold.   Chest tightness.   Shortness of breath.  DIAGNOSIS  Bronchospasm is usually diagnosed through a history and physical exam. Tests, such as chest X-rays, are sometimes done to look for other conditions. TREATMENT   Inhaled medicines can be given to open up your airways and help you breathe. The medicines can be given using either an inhaler or a nebulizer machine.  Corticosteroid medicines may be given for severe bronchospasm, usually when it is associated with asthma. HOME CARE INSTRUCTIONS   Always have a plan prepared for seeking medical care. Know when to call your health care provider and local emergency services (911 in the [place] Know where you can access local emergency care.  Only take medicines as directed by your health care provider.  If you were prescribed an inhaler or nebulizer machine, ask your health care provider to explain how to use it correctly. Always use a spacer with your inhaler if you were given one.  It is necessary to remain calm during an attack. Try to relax and breathe more slowly.  Control your home environment in the following ways:   Change your heating and air conditioning  filter at least once a month.   Limit your use of fireplaces and wood stoves.  Do not smoke and do not allow smoking in your home.   Avoid exposure to perfumes and fragrances.   Get rid of pests (such as roaches and mice) and their droppings.   Throw away plants if you see mold on them.   Keep your house clean and dust free.   Replace carpet with wood, tile, or vinyl flooring. Carpet can trap dander and dust.   Use allergy-proof pillows, mattress covers, and box spring covers.   Wash bed sheets and blankets every week in hot water and dry them in a dryer.   Use blankets that are made of polyester or cotton.   Wash hands frequently. SEEK MEDICAL CARE IF:   You have muscle aches.   You have chest pain.   The sputum changes from clear or white to yellow, green, gray, or bloody.   The sputum you cough up gets thicker.   There are problems that may be related to the medicine you are given, such as a rash, itching, swelling, or trouble breathing.  SEEK IMMEDIATE MEDICAL CARE IF:   You have worsening wheezing and coughing even after taking your prescribed medicines.   You have increased difficulty breathing.   You develop severe  chest pain. MAKE SURE YOU:   Understand these instructions.  Will watch your condition.  Will get help right away if you are not doing well or get worse.   This information is not intended to replace advice given to you by your health care provider. Make sure you discuss any questions you have with your health care provider.   Document Released: 10/04/2003 Document Revised: 10/22/2014 Document Reviewed: 03/23/2013 Elsevier Interactive Patient Education 2016 [employer]. Smoking Cessation, Tips for Success If you are ready to quit smoking, congratulations! You have chosen to help yourself be healthier. Cigarettes bring nicotine, tar, carbon monoxide, and other irritants into your body. Your lungs, heart, and blood vessels will  be able to work better without these poisons. There are many different ways to quit smoking. Nicotine gum, nicotine patches, a nicotine inhaler, or nicotine nasal spray can help with physical craving. Hypnosis, support groups, and medicines help break the habit of smoking. WHAT THINGS CAN I DO TO MAKE QUITTING EASIER?  Here are some tips to help you quit for good:  Pick a date when you will quit smoking completely. Tell all of your friends and family about your plan to quit on that date.  Do not try to slowly cut down on the number of cigarettes you are smoking. Pick a quit date and quit smoking completely starting on that day.  Throw away all cigarettes.   Clean and remove all ashtrays from your home, work, and car.  On a card, write down your reasons for quitting. Carry the card with you and read it when you get the urge to smoke.  Cleanse your body of nicotine. Drink enough water and fluids to keep your urine clear or pale yellow. Do this after quitting to flush the nicotine from your body.  Learn to predict your moods. Do not let a bad situation be your excuse to have a cigarette. Some situations in your life might tempt you into wanting a cigarette.  Never have "just one" cigarette. It leads to wanting another and another. Remind yourself of your decision to quit.  Change habits associated with smoking. If you smoked while driving or when feeling stressed, try other activities to replace smoking. Stand up when drinking your coffee. Brush your teeth after eating. Sit in a different chair when you read the paper. Avoid alcohol while trying to quit, and try to drink fewer caffeinated beverages. Alcohol and caffeine may urge you to smoke.  Avoid foods and drinks that can trigger a desire to smoke, such as sugary or spicy foods and alcohol.  Ask people who smoke not to smoke around you.  Have something planned to do right after eating or having a cup of coffee. For example, plan to take a  walk or exercise.  Try a relaxation exercise to calm you down and decrease your stress. Remember, you may be tense and nervous for the first 2 weeks after you quit, but this will pass.  Find new activities to keep your hands busy. Play with a pen, coin, or rubber band. Doodle or draw things on paper.  Brush your teeth right after eating. This will help cut down on the craving for the taste of tobacco after meals. You can also try mouthwash.   Use oral substitutes in place of cigarettes. Try using lemon drops, carrots, cinnamon sticks, or chewing gum. Keep them handy so they are available when you have the urge to smoke.  When you have the urge to  smoke, try deep breathing.  Designate your home as a nonsmoking area.  If you are a heavy smoker, ask your health care provider about a prescription for nicotine chewing gum. It can ease your withdrawal from nicotine.  Reward yourself. Set aside the cigarette money you save and buy yourself something nice.  Look for support from others. Join a support group or smoking cessation program. Ask someone at home or at work to help you with your plan to quit smoking.  Always ask yourself, "Do I need this cigarette or is this just a reflex?" Tell yourself, "Today, I choose not to smoke," or "I do not want to smoke." You are reminding yourself of your decision to quit.  Do not replace cigarette smoking with electronic cigarettes (commonly called e-cigarettes). The safety of e-cigarettes is unknown, and some may contain harmful chemicals.  If you relapse, do not give up! Plan ahead and think about what you will do the next time you get the urge to smoke. HOW WILL I FEEL WHEN I QUIT SMOKING? You may have symptoms of withdrawal because your body is used to nicotine (the addictive substance in cigarettes). You may crave cigarettes, be irritable, feel very hungry, cough often, get headaches, or have difficulty concentrating. The withdrawal symptoms are only  temporary. They are strongest when you first quit but will go away within 10-14 days. When withdrawal symptoms occur, stay in control. Think about your reasons for quitting. Remind yourself that these are signs that your body is healing and getting used to being without cigarettes. Remember that withdrawal symptoms are easier to treat than the major diseases that smoking can cause.  Even after the withdrawal is over, expect periodic urges to smoke. However, these cravings are generally short lived and will go away whether you smoke or not. Do not smoke! WHAT RESOURCES ARE AVAILABLE TO HELP ME QUIT SMOKING? Your health care provider can direct you to community resources or hospitals for support, which may include:  Group support.  Education.  Hypnosis.  Therapy.   This information is not intended to replace advice given to you by your health care provider. Make sure you discuss any questions you have with your health care provider.   Document Released: 06/29/2004 Document Revised: 10/22/2014 Document Reviewed: 03/19/2013 Elsevier Interactive Patient Education [employer]

## 2015-11-09 ENCOUNTER — Encounter: Payer: Self-pay | Admitting: Cardiovascular Disease

## 2015-11-09 ENCOUNTER — Ambulatory Visit (INDEPENDENT_AMBULATORY_CARE_PROVIDER_SITE_OTHER): Payer: BLUE CROSS/BLUE SHIELD | Admitting: Cardiovascular Disease

## 2015-11-09 VITALS — BP 130/80 | HR 62 | Ht 71.0 in | Wt 159.0 lb

## 2015-11-09 DIAGNOSIS — R072 Precordial pain: Secondary | ICD-10-CM

## 2015-11-09 DIAGNOSIS — R0789 Other chest pain: Secondary | ICD-10-CM

## 2015-11-09 DIAGNOSIS — Z720 Tobacco use: Secondary | ICD-10-CM

## 2015-11-09 HISTORY — DX: Tobacco use: Z72.0

## 2015-11-09 HISTORY — DX: Other chest pain: R07.89

## 2015-11-09 NOTE — Patient Instructions (Signed)
NO CHANGES TODAY  Your physician has requested that you have an exercise tolerance test. For further information please visit [email or web address] Please also follow instruction sheet, as given.   Your physician recommends that you schedule a follow-up appointment as needed basis if test is normal.

## 2015-11-09 NOTE — Progress Notes (Signed)
Cardiology Office Note   Date:  11/09/2015   ID:  Gary Hansen, DOB [birth year], MRN [id number]  PCP:  Default, Provider, MD  Cardiologist:   Madilyn Hook, MD   Chief Complaint  Patient presents with  . New Evaluation    chest pain and jaw pain,no sob, edema      History of Present Illness: Gary Hansen is a 40 y.o. male with ongoing tobacco abuse who presents for an evaluation of chest pain.  Gary Hansen reports bad jaw pain that occurs every 2-3 months.  The pain feels like a toothache and is followed by chest pain.  The episodes last for 2-3 minutes at a time.  The chest pain is 9/10 in severity, occurs in the center of his chest and does not radiate.  There is no associated shortness of breath, nausea, vomiting or diaphoresis.  It sometimes occurs after eating or when watching tv.  He has noticed it after eating spaghetti with red sauce but it also occurs when he has not eaten.  He works as a Loss adjuster, [employer] and does not get the symptoms when exerting himself at work.  He notes occasional palpitations when laying bed at night.  They last for one minute and are not associated with chest pain, shortness of breath, lightheadedness or dizziness.   Past Medical History  Diagnosis Date  . Inguinal hernia 04/2012    right  . Dental crowns present   . Atypical chest pain 11/09/2015  . Tobacco abuse 11/09/2015    Past Surgical History  Procedure Laterality Date  . Wisdom tooth extraction    . Inguinal hernia repair  05/19/2012    Procedure: HERNIA REPAIR INGUINAL ADULT;  Surgeon: Wilmon Arms. Corliss Skains, MD;  Location: [facility];  Service: General;  Laterality: Right;  . Hernia repair  02/2009    left inguinal hernia  . Hernia repair  05/19/12    Right ing hernia     No current outpatient prescriptions on file.   No current facility-administered medications for this visit.    Allergies:   Review of patient's allergies indicates no known allergies.    Social  History:  The patient  reports that he has been smoking Cigarettes.  He has a 5 pack-year smoking history. He has never used smokeless tobacco. He reports that he does not drink alcohol or use illicit drugs.   Family History:  The patient's family history includes CAD in his paternal grandfather; Heart failure in his paternal grandmother.    ROS:  Please see the history of present illness.   Otherwise, review of systems are positive for none.   All other systems are reviewed and negative.    PHYSICAL EXAM: VS:  BP 130/80 mmHg  Pulse 62  Ht  (1.803 m)  Wt 72.122 kg (159 lb)  BMI 22.19 kg/m2 , BMI Body mass index is 22.19 kg/(m^2). GENERAL:  Well appearing HEENT:  Pupils equal round and reactive, fundi not visualized, oral mucosa unremarkable NECK:  No jugular venous distention, waveform within normal limits, carotid upstroke brisk and symmetric, no bruits, no thyromegaly LYMPHATICS:  No cervical adenopathy LUNGS:  Clear to auscultation bilaterally HEART:  RRR.  PMI not displaced or sustained,S1 and S2 within normal limits, no S3, no S4, no clicks, no rubs, no murmurs ABD:  Flat, positive bowel sounds normal in frequency in pitch, no bruits, no rebound, no guarding, no midline pulsatile mass, no hepatomegaly, no splenomegaly EXT:  2 plus pulses throughout, no edema, no cyanosis no clubbing SKIN:  No rashes no nodules NEURO:  Cranial nerves II through XII grossly intact, motor grossly intact throughout PSYCH:  Cognitively intact, oriented to person place and time    EKG:  EKG is ordered today. The ekg ordered today demonstrates sinus arhythmia.  Rate 62 bpm.     Recent Labs: No results found for requested labs within last 365 days.    Lipid Panel No results found for: CHOL, TRIG, HDL, CHOLHDL, VLDL, LDLCALC, LDLDIRECT    Wt Readings from Last 3 Encounters:  11/09/15 72.122 kg (159 lb)  07/26/15 71.396 kg (157 lb 6.4 oz)  10/20/12 76.204 kg (168 lb)      ASSESSMENT  AND PLAN:  # Atypical chest pain:  Symptoms seem to be more consistent with GERD than angina.  We will do a treadmill stress test to assess for ischemia given his tobacco abuse and family history.  If this is negative, we will recommend a trial of treatment for GERD.  # Tobacco abuse: Mr. Trawick is interested in tobacco cessation.  He will try over the counter nicotine replacement gum.  If this does not work, he would like to try Chantix.  We spent 5 minutes discussing his readiness to quit and cessation option.     Current medicines are reviewed at length with the patient today.  The patient does not have concerns regarding medicines.  The following changes have been made:  no change  Labs/ tests ordered today include:   Orders Placed This Encounter  Procedures  . Exercise Tolerance Test  . EKG 12-Lead     Disposition:   FU with Anwen Cannedy C. Duke Salvia, MD, [facility] as needed   This note was written with the assistance of speech recognition software.  Please excuse any transcriptional errors.  Signed, Gary Hansen C. Duke Salvia, MD, [facility]  11/09/2015 7:44 PM    [facility]

## 2015-11-22 ENCOUNTER — Telehealth (HOSPITAL_COMMUNITY): Payer: Self-pay

## 2015-11-22 NOTE — Telephone Encounter (Signed)
Encounter complete. 

## 2015-11-24 ENCOUNTER — Ambulatory Visit (HOSPITAL_COMMUNITY)
Admission: RE | Admit: 2015-11-24 | Discharge: 2015-11-24 | Disposition: A | Discharge status: Home / Self Care | Payer: BLUE CROSS/BLUE SHIELD | Source: Ambulatory Visit | Attending: Cardiovascular Disease | Admitting: Cardiovascular Disease

## 2015-11-24 DIAGNOSIS — R072 Precordial pain: Secondary | ICD-10-CM | POA: Diagnosis not present

## 2015-11-24 LAB — EXERCISE TOLERANCE TEST
CHL RATE OF PERCEIVED EXERTION: 17
CSEPED: 8 min
CSEPEDS: 0 s
CSEPEW: 13.7 METS
CSEPHR: 107 %
CSEPPHR: 196 {beats}/min
MPHR: 183 {beats}/min
Rest HR: 64 {beats}/min

## 2015-12-02 ENCOUNTER — Telehealth: Payer: Self-pay | Admitting: *Deleted

## 2015-12-02 NOTE — Telephone Encounter (Signed)
-----   Message from Chilton Si, MD sent at 11/24/2015  6:14 PM EST ----- Normal stress test.

## 2015-12-02 NOTE — Telephone Encounter (Signed)
LEFT MESSAGE TO CALL BACK- IN REGARDS STRESS TEST

## 2015-12-05 NOTE — Telephone Encounter (Signed)
Returning your call from Friday,regarding his stress test results.

## 2015-12-05 NOTE — Telephone Encounter (Signed)
Spoke to patient. Result given . Verbalized understanding  

## 2015-12-31 ENCOUNTER — Other Ambulatory Visit: Payer: Self-pay | Admitting: Internal Medicine

## 2015-12-31 ENCOUNTER — Telehealth: Payer: Self-pay | Admitting: *Deleted

## 2015-12-31 NOTE — Telephone Encounter (Signed)
Spoke with patient filled inhaler once but he will need to come in for additional refills.  Patient understood

## 2016-12-08 ENCOUNTER — Emergency Department (HOSPITAL_BASED_OUTPATIENT_CLINIC_OR_DEPARTMENT_OTHER)
Admission: EM | Admit: 2016-12-08 | Discharge: 2016-12-08 | Disposition: A | Discharge status: Home / Self Care | Payer: BLUE CROSS/BLUE SHIELD | Attending: Emergency Medicine | Admitting: Emergency Medicine

## 2016-12-08 ENCOUNTER — Encounter (HOSPITAL_BASED_OUTPATIENT_CLINIC_OR_DEPARTMENT_OTHER): Payer: Self-pay | Admitting: Emergency Medicine

## 2016-12-08 DIAGNOSIS — K625 Hemorrhage of anus and rectum: Secondary | ICD-10-CM | POA: Diagnosis present

## 2016-12-08 DIAGNOSIS — R05 Cough: Secondary | ICD-10-CM | POA: Diagnosis not present

## 2016-12-08 DIAGNOSIS — K5900 Constipation, unspecified: Secondary | ICD-10-CM | POA: Insufficient documentation

## 2016-12-08 DIAGNOSIS — K602 Anal fissure, unspecified: Secondary | ICD-10-CM | POA: Diagnosis not present

## 2016-12-08 DIAGNOSIS — F17210 Nicotine dependence, cigarettes, uncomplicated: Secondary | ICD-10-CM | POA: Insufficient documentation

## 2016-12-08 DIAGNOSIS — R059 Cough, unspecified: Secondary | ICD-10-CM

## 2016-12-08 MED ORDER — BENZONATATE 100 MG PO CAPS: 100.0000 mg | ORAL_CAPSULE | Freq: Three times a day (TID) | ORAL | 0 refills | Status: DC

## 2016-12-08 NOTE — Discharge Instructions (Signed)
Use stool softener as needed for constipation (mira-Lax, over the counter. Avoid straining at stools. Tessalon for cough.

## 2016-12-08 NOTE — ED Triage Notes (Signed)
Non-productive cough since yesterday. Took Mucinex without relief . Also having blood in stool x 4 days, last week was constipated. Bright red blood, no rectal pain or cramping to abd

## 2016-12-08 NOTE — ED Provider Notes (Signed)
[facility] Provider Note   CSN: [id number] Arrival date & time: 12/08/16  1159     History   Chief Complaint Chief Complaint  Patient presents with  . Rectal Bleeding  . Cough    HPI Illene LabradorGregory A Hansen is a 41 y.o. male. He complains of a cough for the last few days. He states he feels like he is getting a chest cold. Also states that he was constipated last week and had to strain at stool. Did not take anything for this. It resolved. Over the last 3 days he noticed that when he has a formed bowel movement he has some blood on the paper. He has no perirectal pain swelling or hemorrhoid.  HPI  Past Medical History:  Diagnosis Date  . Atypical chest pain 11/09/2015  . Dental crowns present   . Inguinal hernia 04/2012   right  . Tobacco abuse 11/09/2015    Patient Active Problem List   Diagnosis Date Noted  . Atypical chest pain 11/09/2015  . Tobacco abuse 11/09/2015  . Right inguinal hernia 04/15/2012    Past Surgical History:  Procedure Laterality Date  . HERNIA REPAIR  02/2009   left inguinal hernia  . HERNIA REPAIR  05/19/12   Right ing hernia  . INGUINAL HERNIA REPAIR  05/19/2012   Procedure: HERNIA REPAIR INGUINAL ADULT;  Surgeon: Wilmon ArmsMatthew K. Corliss Skainssuei, MD;  Location: [facility];  Service: General;  Laterality: Right;  . WISDOM TOOTH EXTRACTION         Home Medications    Prior to Admission medications   Medication Sig Start Date End Date Taking? Authorizing Provider  benzonatate (TESSALON) 100 MG capsule Take 1 capsule (100 mg total) by mouth every 8 (eight) hours. 12/08/16   Rolland PorterMark Kenyetta Wimbish, MD  PROAIR HFA 108 [phone](90 Base) MCG/ACT inhaler INHALE 2 PUFFS INTO THE LUNGS EVERY 6 (SIX) HOURS AS NEEDED FOR WHEEZING OR SHORTNESS OF BREATH. 12/31/15   Jonita Albeehris W Guest, MD    Family History Family History  Problem Relation Age of Onset  . CAD Paternal Grandfather   . Heart failure Paternal Grandmother     Social History Social History  Substance Use  Topics  . Smoking status: Current Every Day Smoker    Packs/day: 0.50    Years: 10.00    Types: Cigarettes  . Smokeless tobacco: Never Used  . Alcohol use No     Allergies   Patient has no known allergies.   Review of Systems Review of Systems  Constitutional: Negative for appetite change, chills, diaphoresis, fatigue and fever.  HENT: Negative for mouth sores, sore throat and trouble swallowing.   Eyes: Negative for visual disturbance.  Respiratory: Positive for cough. Negative for chest tightness, shortness of breath and wheezing.   Cardiovascular: Negative for chest pain.  Gastrointestinal: Positive for anal bleeding and constipation. Negative for abdominal distention, abdominal pain, diarrhea, nausea and vomiting.  Endocrine: Negative for polydipsia, polyphagia and polyuria.  Genitourinary: Negative for dysuria, frequency and hematuria.  Musculoskeletal: Negative for gait problem.  Skin: Negative for color change, pallor and rash.  Neurological: Negative for dizziness, syncope, light-headedness and headaches.  Hematological: Does not bruise/bleed easily.  Psychiatric/Behavioral: Negative for behavioral problems and confusion.     Physical Exam Updated Vital Signs BP 114/78 (BP Location: Right Arm)   Pulse 88   Temp 98.3 F (36.8 C) (Oral)   Resp 18   Ht 5\' 10"  (1.778 m)   Wt 155 lb (70.3 kg)  SpO2 100%   BMI 22.24 kg/m   Physical Exam  Constitutional: He is oriented to person, place, and time. He appears well-developed and well-nourished. No distress.  HENT:  Head: Normocephalic.  Normal HEENT exam.  Eyes: Conjunctivae are normal. Pupils are equal, round, and reactive to light. No scleral icterus.  Neck: Normal range of motion. Neck supple. No thyromegaly present.  Cardiovascular: Normal rate and regular rhythm.  Exam reveals no gallop and no friction rub.   No murmur heard. Pulmonary/Chest: Effort normal and breath sounds normal. No respiratory distress. He  has no wheezes. He has no rales.  Clear bilateral breath sounds.  Abdominal: Soft. Bowel sounds are normal. He exhibits no distension. There is no tenderness. There is no rebound.  Small anal fissure at 12:00 position on the perianal tissue.  Musculoskeletal: Normal range of motion.  Neurological: He is alert and oriented to person, place, and time.  Skin: Skin is warm and dry. No rash noted.  Psychiatric: He has a normal mood and affect. His behavior is normal.     ED Treatments / Results  Labs (all labs ordered are listed, but only abnormal results are displayed) Labs Reviewed - No data to display  EKG  EKG Interpretation None       Radiology No results found.  Procedures Procedures (including critical care time)  Medications Ordered in ED Medications - No data to display   Initial Impression / Assessment and Plan / ED Course  I have reviewed the triage vital signs and the nursing notes.  Pertinent labs & imaging results that were available during my care of the patient were reviewed by me and considered in my medical decision making (see chart for details).     Tessalon for his cough. Likely secondary to viral upper respiratory infection. 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 Avoid straining. Avoid large bowel movements. Softeners and MiraLAX as needed.  Final Clinical Impressions(s) / ED Diagnoses   Final diagnoses:  Cough  Anal fissure    New Prescriptions New Prescriptions   BENZONATATE (TESSALON) 100 MG CAPSULE    Take 1 capsule (100 mg total) by mouth every 8 (eight) hours.     Rolland Porter, MD 12/08/16 1332

## 2016-12-10 ENCOUNTER — Ambulatory Visit (INDEPENDENT_AMBULATORY_CARE_PROVIDER_SITE_OTHER): Payer: BLUE CROSS/BLUE SHIELD | Admitting: Physician Assistant

## 2016-12-10 VITALS — BP 110/68 | HR 98 | Temp 99.2°F | Resp 16 | Ht 70.4 in | Wt 161.4 lb

## 2016-12-10 DIAGNOSIS — J111 Influenza due to unidentified influenza virus with other respiratory manifestations: Secondary | ICD-10-CM

## 2016-12-10 MED ORDER — ALBUTEROL SULFATE HFA 108 (90 BASE) MCG/ACT IN AERS: 2.0000 | INHALATION_SPRAY | RESPIRATORY_TRACT | 1 refills | Status: DC | PRN

## 2016-12-10 MED ORDER — IPRATROPIUM BROMIDE 0.03 % NA SOLN: 2.0000 | Freq: Two times a day (BID) | NASAL | 0 refills | Status: DC

## 2016-12-10 NOTE — Patient Instructions (Addendum)
Maintain hydration (at least 64oz daily) by drinking small amounts of clear fluids frequently. Take Tylenol or Ibuprofen for pain and fever as bottle states. Fill the Tessalon prescription and discontinue the codeine cough medicine.     IF you received an x-ray today, you will receive an invoice from [facility]. Please contact [facility] at [phone] with questions or concerns regarding your invoice.   IF you received labwork today, you will receive an invoice from [facility]. Please contact [facility] at [phone] with questions or concerns regarding your invoice.   Our billing staff will not be able to assist you with questions regarding bills from these companies.  You will be contacted with the lab results as soon as they are available. The fastest way to get your results is to activate your My Chart account. Instructions are located on the last page of this paperwork. If you have not heard from us regarding the results in 2 weeks, please contact this office.     Influenza, Adult Influenza ("the flu") is an infection in the lungs, nose, and throat (respiratory tract). It is caused by a virus. The flu causes many common cold symptoms, as well as a high fever and body aches. It can make you feel very sick. The flu spreads easily from person to person (is contagious). Getting a flu shot (influenza vaccination) every year is the best way to prevent the flu. Follow these instructions at home:  Take over-the-counter and prescription medicines only as told by your doctor.  Use a cool mist humidifier to add moisture (humidity) to the air in your home. This can make it easier to breathe.  Rest as needed.  Drink enough fluid to keep your pee (urine) clear or pale yellow.  Cover your mouth and nose when you cough or sneeze.  Wash your hands with soap and water often, especially after you cough or sneeze. If you cannot use soap and water, use hand sanitizer.  Stay home  from work or school as told by your doctor. Unless you are visiting your doctor, try to avoid leaving home until your fever has been gone for 24 hours without the use of medicine.  Keep all follow-up visits as told by your doctor. This is important. How is this prevented?  Getting a yearly (annual) flu shot is the best way to avoid getting the flu. You may get the flu shot in late summer, fall, or winter. Ask your doctor when you should get your flu shot.  Wash your hands often or use hand sanitizer often.  Avoid contact with people who are sick during cold and flu season.  Eat healthy foods.  Drink plenty of fluids.  Get enough sleep.  Exercise regularly. Contact a doctor if:  You get new symptoms.  You have:  Chest pain.  Watery poop (diarrhea).  A fever.  Your cough gets worse.  You start to have more mucus.  You feel sick to your stomach (nauseous).  You throw up (vomit). Get help right away if:  You start to be short of breath or have trouble breathing.  Your skin or nails turn a bluish color.  You have very bad pain or stiffness in your neck.  You get a sudden headache.  You get sudden pain in your face or ear.  You cannot stop throwing up. This information is not intended to replace advice given to you by your health care provider. Make sure you discuss any questions you have with your health  care provider. Document Released: 07/10/2008 Document Revised: 03/08/2016 Document Reviewed: 07/26/2015 Elsevier Interactive Patient Education  2017 [employer].   Did you know that you begin to benefit from quitting smoking within the first twenty minutes? It's TRUE.  At 20 minutes: -blood pressure decreases -pulse rate drops -body temperature of hands and feet increases  At 8 hours: -carbon monoxide level in blood drops to normal -oxygen level in blood increases to normal  At 24 hours: -the chance of heart attack decreases  At 48 hours: -nerve  endings start regrowing -ability to smell and taste is enhanced  2 weeks-3 months: -circulation improves -walking becomes easier -lung function improves  1-9 months: -coughing, sinus congestion, fatigue and shortness of breath decreases  1 year: -excess risk of heart disease is decreased to HALF that of a smoker  5 years: Stroke risk is reduced to that of people who have never smoked  10 years: -risk of lung cancer drops to as little as half that of continuing smokers -risk of cancer of the mouth, throat, esophagus, bladder, kidney and pancreas decreases -risk of ulcer decreases  15 years -risk of heart disease is now similar to that of people who have never smoked -risk of death returns to nearly the level of people who have never smoked

## 2016-12-10 NOTE — Progress Notes (Deleted)
     Patient ID: Gary Hansen, male     DOB: 07/07/1976, 89[name]10 y.o.    MRN: [id number]  PCP: Default, Provider, MD  Chief Complaint  Patient presents with  . Cough    nonproductive  . Fever    102.0 this AM    Subjective:   This patient is new to {PERSONS; PROVIDER:19547} and presents for evaluation of ***.  ***. Flu sym started yesterday Friday was the cough only Review of Systems   Prior to Admission medications   Medication Sig Start Date End Date Taking? Authorizing Provider  benzonatate (TESSALON) 100 MG capsule Take 1 capsule (100 mg total) by mouth every 8 (eight) hours. Patient not taking: Reported on 12/10/2016 12/08/16   Rolland PorterMark James, MD  PROAIR HFA 108 [phone](90 Base) MCG/ACT inhaler INHALE 2 PUFFS INTO THE LUNGS EVERY 6 (SIX) HOURS AS NEEDED FOR WHEEZING OR SHORTNESS OF BREATH. Patient not taking: Reported on 12/10/2016 12/31/15   Gary Hansen W Guest, MD     No Known Allergies   Patient Active Problem List   Diagnosis Date Noted  . Atypical chest pain 11/09/2015  . Tobacco abuse 11/09/2015  . Right inguinal hernia 04/15/2012     Family History  Problem Relation Age of Onset  . CAD Paternal Grandfather   . Heart failure Paternal Grandmother      Social History   Social History  . Marital status: Married    Spouse name: N/A  . Number of children: N/A  . Years of education: N/A   Occupational History  . Not on file.   Social History Main Topics  . Smoking status: Current Every Day Smoker    Packs/day: 0.50    Years: 10.00    Types: Cigarettes  . Smokeless tobacco: Never Used  . Alcohol use No  . Drug use: No  . Sexual activity: Not on file   Other Topics Concern  . Not on file   Social History Narrative  . No narrative on file         Objective:  Physical Exam           Assessment & Plan:  ***

## 2016-12-10 NOTE — Progress Notes (Signed)
Patient ID: Gary Hansen, male    DOB: 1976-02-01, 41 y.o.   MRN: [id number]  PCP: Default, Provider, MD  Chief Complaint  Patient presents with  . Cough    nonproductive  . Fever    102.0 this AM    Subjective:   Presents for evaluation of cough and fever.  Symptoms began Friday 12/07/2016 with nonproductive cough. He was seen in the ED on 12/08/2016. Thought cough likely due to evolving viral respiratory infection. Rx'd tessalon perles, which he did not get filled. Using OTC Mucinex and leftover codeine cough syrup. Yesterday developed fever. OTC fever reducer with good results, but temporary.  Coworker has influenza.   Review of Systems As above.    Patient Active Problem List   Diagnosis Date Noted  . Atypical chest pain 11/09/2015  . Tobacco abuse 11/09/2015  . Right inguinal hernia 04/15/2012     Prior to Admission medications   Medication Sig Start Date End Date Taking? Authorizing Provider  benzonatate (TESSALON) 100 MG capsule Take 1 capsule (100 mg total) by mouth every 8 (eight) hours. Patient not taking: Reported on 12/10/2016 12/08/16   Rolland Porter, MD  PROAIR HFA [phone] Base) MCG/ACT inhaler INHALE 2 PUFFS INTO THE LUNGS EVERY 6 (SIX) HOURS AS NEEDED FOR WHEEZING OR SHORTNESS OF BREATH. Patient not taking: Reported on 12/10/2016 12/31/15   Jonita Albee, MD     No Known Allergies     Objective:  Physical Exam  Constitutional: He is oriented to person, place, and time. He appears well-developed and well-nourished. No distress.  BP 110/68 (BP Location: Left Arm, Patient Position: Sitting, Cuff Size: Normal)   Pulse 98   Temp 99.2 F (37.3 C) (Oral)   Resp 16   Ht 5' 10.4" (1.788 m)   Wt 161 lb 6.4 oz (73.2 kg)   SpO2 96%   BMI 22.90 kg/m    HENT:  Head: Normocephalic and atraumatic.  Right Ear: Hearing, tympanic membrane, external ear and ear canal normal.  Left Ear: Hearing, tympanic membrane, external ear and ear canal normal.    Nose: Mucosal edema and rhinorrhea present.  No foreign bodies. Right sinus exhibits no maxillary sinus tenderness and no frontal sinus tenderness. Left sinus exhibits no maxillary sinus tenderness and no frontal sinus tenderness.  Mouth/Throat: Uvula is midline, oropharynx is clear and moist and mucous membranes are normal. No uvula swelling. No oropharyngeal exudate.  Eyes: Conjunctivae and EOM are normal. Pupils are equal, round, and reactive to light. Right eye exhibits no discharge. Left eye exhibits no discharge. No scleral icterus.  Neck: Trachea normal, normal range of motion and full passive range of motion without pain. Neck supple. No thyroid mass and no thyromegaly present.  Cardiovascular: Normal rate, regular rhythm and normal heart sounds.   Pulmonary/Chest: Effort normal and breath sounds normal.  Lymphadenopathy:       Head (right side): No submandibular, no tonsillar, no preauricular, no posterior auricular and no occipital adenopathy present.       Head (left side): No submandibular, no tonsillar, no preauricular and no occipital adenopathy present.    He has no cervical adenopathy.       Right: No supraclavicular adenopathy present.       Left: No supraclavicular adenopathy present.  Neurological: He is alert and oriented to person, place, and time. He has normal strength. No cranial nerve deficit or sensory deficit.  Skin: Skin is warm, dry and intact. No rash noted.  Psychiatric: He has a normal mood and affect. His speech is normal and behavior is normal.           Assessment & Plan:   1. Influenza with respiratory manifestation other than pneumonia Symptoms likely represent influenza, though presentation did not initially appear so. Supportive care.  Anticipatory guidance.  RTC if symptoms worsen/persist. - ipratropium (ATROVENT) 0.03 % nasal spray; Place 2 sprays into both nostrils 2 (two) times daily.  Dispense: 30 mL; Refill: 0 - albuterol (PROVENTIL  HFA;VENTOLIN HFA) 108 (90 Base) MCG/ACT inhaler; Inhale 2 puffs into the lungs every 4 (four) hours as needed for wheezing or shortness of breath (cough, shortness of breath or wheezing.).  Dispense: 1 Inhaler; Refill: 1   Fernande Brashelle S. Cheyan Frees, PA-C Physician Assistant-Certified Primary Care at [facility]

## 2017-01-06 ENCOUNTER — Other Ambulatory Visit: Payer: Self-pay | Admitting: Physician Assistant

## 2017-01-06 DIAGNOSIS — J111 Influenza due to unidentified influenza virus with other respiratory manifestations: Secondary | ICD-10-CM

## 2017-01-29 ENCOUNTER — Other Ambulatory Visit: Payer: Self-pay

## 2017-01-29 DIAGNOSIS — J111 Influenza due to unidentified influenza virus with other respiratory manifestations: Secondary | ICD-10-CM

## 2017-01-29 MED ORDER — IPRATROPIUM BROMIDE 0.03 % NA SOLN: 2.0000 | Freq: Two times a day (BID) | NASAL | 3 refills | Status: DC

## 2017-01-29 NOTE — Telephone Encounter (Signed)
01/10/16 last refill

## 2017-05-03 ENCOUNTER — Encounter (HOSPITAL_COMMUNITY): Payer: Self-pay | Admitting: Emergency Medicine

## 2017-05-03 DIAGNOSIS — Z79899 Other long term (current) drug therapy: Secondary | ICD-10-CM | POA: Diagnosis not present

## 2017-05-03 DIAGNOSIS — N201 Calculus of ureter: Secondary | ICD-10-CM | POA: Insufficient documentation

## 2017-05-03 DIAGNOSIS — F17210 Nicotine dependence, cigarettes, uncomplicated: Secondary | ICD-10-CM | POA: Insufficient documentation

## 2017-05-03 DIAGNOSIS — R109 Unspecified abdominal pain: Secondary | ICD-10-CM | POA: Diagnosis present

## 2017-05-03 LAB — COMPREHENSIVE METABOLIC PANEL
ALK PHOS: 40 U/L (ref 38–126)
ALT: 27 U/L (ref 17–63)
ANION GAP: 9 (ref 5–15)
AST: 26 U/L (ref 15–41)
Albumin: 4.1 g/dL (ref 3.5–5.0)
BILIRUBIN TOTAL: 0.9 mg/dL (ref 0.3–1.2)
BUN: 18 mg/dL (ref 6–20)
CALCIUM: 8.9 mg/dL (ref 8.9–10.3)
CO2: 27 mmol/L (ref 22–32)
CREATININE: 1.12 mg/dL (ref 0.61–1.24)
Chloride: 104 mmol/L (ref 101–111)
GFR calc non Af Amer: >60 mL/min (ref >60)
Glucose, Bld: 107 mg/dL — ABNORMAL HIGH (ref 65–99)
Potassium: 3.2 mmol/L — ABNORMAL LOW (ref 3.5–5.1)
SODIUM: 140 mmol/L (ref 135–145)
TOTAL PROTEIN: 6.9 g/dL (ref 6.5–8.1)

## 2017-05-03 LAB — CBC
HCT: 37.5 % — ABNORMAL LOW (ref 39.0–52.0)
HEMOGLOBIN: 13.8 g/dL (ref 13.0–17.0)
MCH: 29.1 pg (ref 26.0–34.0)
MCHC: 36.8 g/dL — ABNORMAL HIGH (ref 30.0–36.0)
MCV: 79.1 fL (ref 78.0–100.0)
PLATELETS: 259 10*3/uL (ref 150–400)
RBC: 4.74 MIL/uL (ref 4.22–5.81)
RDW: 11.7 % (ref 11.5–15.5)
WBC: 7.1 10*3/uL (ref 4.0–10.5)

## 2017-05-03 LAB — LIPASE, BLOOD: Lipase: 23 U/L (ref 11–51)

## 2017-05-03 NOTE — ED Triage Notes (Signed)
Patient is complaining of lower abdominal pain, right flank pain, and pain in his penis area. The pain started yesterday. Patient is also complaining of blood in his urine. The blood started today.

## 2017-05-04 ENCOUNTER — Emergency Department (HOSPITAL_COMMUNITY): Payer: BLUE CROSS/BLUE SHIELD

## 2017-05-04 ENCOUNTER — Emergency Department (HOSPITAL_COMMUNITY)
Admission: EM | Admit: 2017-05-04 | Discharge: 2017-05-04 | Disposition: A | Discharge status: Home / Self Care | Payer: BLUE CROSS/BLUE SHIELD | Attending: Emergency Medicine | Admitting: Emergency Medicine

## 2017-05-04 DIAGNOSIS — N201 Calculus of ureter: Secondary | ICD-10-CM

## 2017-05-04 LAB — URINALYSIS, ROUTINE W REFLEX MICROSCOPIC
BILIRUBIN URINE: NEGATIVE
Bacteria, UA: NONE SEEN
Glucose, UA: NEGATIVE mg/dL
Ketones, ur: NEGATIVE mg/dL
Leukocytes, UA: NEGATIVE
Nitrite: NEGATIVE
PROTEIN: 100 mg/dL — AB
SQUAMOUS EPITHELIAL / LPF: NONE SEEN
Specific Gravity, Urine: 1.032 — ABNORMAL HIGH (ref 1.005–1.030)
pH: 5 (ref 5.0–8.0)

## 2017-05-04 MED ORDER — ONDANSETRON HCL 4 MG/2ML IJ SOLN
4.0000 mg | Freq: Once | INTRAMUSCULAR | Status: AC
  Administered 2017-05-04: 4 mg via INTRAVENOUS
  Filled 2017-05-04: qty 2

## 2017-05-04 MED ORDER — ONDANSETRON 8 MG PO TBDP: 8.0000 mg | ORAL_TABLET | Freq: Three times a day (TID) | ORAL | 0 refills | Status: DC | PRN

## 2017-05-04 MED ORDER — SODIUM CHLORIDE 0.9 % IV SOLN
Freq: Once | INTRAVENOUS | Status: AC
  Administered 2017-05-04: 01:00:00 via INTRAVENOUS

## 2017-05-04 MED ORDER — HYDROMORPHONE HCL 1 MG/ML IJ SOLN
1.0000 mg | Freq: Once | INTRAMUSCULAR | Status: AC
  Administered 2017-05-04: 1 mg via INTRAVENOUS
  Filled 2017-05-04: qty 1

## 2017-05-04 MED ORDER — HYDROMORPHONE HCL 2 MG PO TABS: 2.0000 mg | ORAL_TABLET | ORAL | 0 refills | Status: DC | PRN

## 2017-05-04 NOTE — ED Provider Notes (Addendum)
[facility]-EMER[facility]ENCY DEPT Provider Note: Lowella Dell, MD, FACEP  CSN: [id number] MRN: [id number] ARRIVAL: 05/03/17 at 2238 ROOM: WA10/WA10   CHIEF COMPLAINT  Abdominal Pain   HISTORY OF PRESENT ILLNESS  Gary Hansen is a 41 y.o. male with a 2 day history of right-sided abdominal pain with radiation to his right testicle. He describes the pain as sharp and it became severe this evening. There is been associated gross hematuria. Pain is minimally changed with movement. He describes the pain as similar to pain associated with inguinal herniorrhaphies. He denies associated nausea or vomiting. He denies a history of kidney stones.  Consultation with the [facility] state controlled substances database reveals the patient has received no opioid prescriptions in the past year.   Past Medical History:  Diagnosis Date  . Atypical chest pain 11/09/2015  . Dental crowns present   . Inguinal hernia 04/2012   right  . Tobacco abuse 11/09/2015    Past Surgical History:  Procedure Laterality Date  . HERNIA REPAIR  02/2009   left inguinal hernia  . HERNIA REPAIR  05/19/12   Right ing hernia  . INGUINAL HERNIA REPAIR  05/19/2012   Procedure: HERNIA REPAIR INGUINAL ADULT;  Surgeon: Wilmon Arms. Corliss Skains, MD;  Location: [facility];  Service: General;  Laterality: Right;  . WISDOM TOOTH EXTRACTION      Family History  Problem Relation Age of Onset  . CAD Paternal Grandfather   . Heart failure Paternal Grandmother     Social History  Substance Use Topics  . Smoking status: Current Every Day Smoker    Packs/day: 0.50    Years: 10.00    Types: Cigarettes  . Smokeless tobacco: Never Used  . Alcohol use No    Prior to Admission medications   Medication Sig Start Date End Date Taking? Authorizing Provider  albuterol (PROVENTIL HFA;VENTOLIN HFA) 108 (90 Base) MCG/ACT inhaler Inhale 2 puffs into the lungs every 4 (four) hours as needed for wheezing or shortness of breath (cough,  shortness of breath or wheezing.). 12/10/16   Porfirio Oar, PA-C  benzonatate (TESSALON) 100 MG capsule Take 1 capsule (100 mg total) by mouth every 8 (eight) hours. Patient not taking: Reported on 12/10/2016 12/08/16   Rolland Porter, MD  ipratropium (ATROVENT) 0.03 % nasal spray Place 2 sprays into both nostrils 2 (two) times daily. 01/29/17   Porfirio Oar, PA-C    Allergies Patient has no known allergies.   REVIEW OF SYSTEMS  Negative except as noted here or in the History of Present Illness.   PHYSICAL EXAMINATION  Initial Vital Signs Blood pressure 132/82, pulse 67, temperature 98.2 F (36.8 C), temperature source Oral, resp. rate 18, height 5\' 10"  (1.778 m), weight 72.6 kg (160 lb), SpO2 99 %.  Examination General: Well-developed, well-nourished male in no acute distress; appearance consistent with age of record HENT: normocephalic; atraumatic Eyes: pupils equal, round and reactive to light; extraocular muscles intact Neck: supple Heart: regular rate and rhythm Lungs: clear to auscultation bilaterally Abdomen: soft; nondistended; mild right lower quadrant tenderness; bowel sounds present GU: No right testicular tenderness Extremities: No deformity; full range of motion; pulses normal Neurologic: Awake, alert and oriented; motor function intact in all extremities and symmetric; no facial droop Skin: Warm and dry Psychiatric: Normal mood and affect   RESULTS  Summary of this visit's results, reviewed by myself:   EKG Interpretation  Date/Time:    Ventricular Rate:    PR Interval:    QRS  Duration:   QT Interval:    QTC Calculation:   R Axis:     Text Interpretation:        Laboratory Studies: Results for orders placed or performed during the hospital encounter of 05/04/17 (from the past 24 hour(s))  Lipase, blood     Status: None   Collection Time: 05/03/17 11:10 PM  Result Value Ref Range   Lipase 23 11 - 51 U/L  Comprehensive metabolic panel     Status:  Abnormal   Collection Time: 05/03/17 11:10 PM  Result Value Ref Range   Sodium 140 135 - 145 mmol/L   Potassium 3.2 (L) 3.5 - 5.1 mmol/L   Chloride 104 101 - 111 mmol/L   CO2 27 22 - 32 mmol/L   Glucose, Bld 107 (H) 65 - 99 mg/dL   BUN 18 6 - 20 mg/dL   Creatinine, Ser 4.13 0.61 - 1.24 mg/dL   Calcium 8.9 8.9 - 24.4 mg/dL   Total Protein 6.9 6.5 - 8.1 g/dL   Albumin 4.1 3.5 - 5.0 g/dL   AST 26 15 - 41 U/L   ALT 27 17 - 63 U/L   Alkaline Phosphatase 40 38 - 126 U/L   Total Bilirubin 0.9 0.3 - 1.2 mg/dL   GFR calc non Af Amer >60 >60 mL/min   GFR calc Af Amer >60 >60 mL/min   Anion gap 9 5 - 15  CBC     Status: Abnormal   Collection Time: 05/03/17 11:10 PM  Result Value Ref Range   WBC 7.1 4.0 - 10.5 K/uL   RBC 4.74 4.22 - 5.81 MIL/uL   Hemoglobin 13.8 13.0 - 17.0 g/dL   HCT 01.0 (L) 27.2 - 53.6 %   MCV 79.1 78.0 - 100.0 fL   MCH 29.1 26.0 - 34.0 pg   MCHC 36.8 (H) 30.0 - 36.0 g/dL   RDW 64.4 03.4 - 74.2 %   Platelets 259 150 - 400 K/uL  Urinalysis, Routine w reflex microscopic- may I&O cath if menses     Status: Abnormal   Collection Time: 05/04/17 12:05 AM  Result Value Ref Range   Color, Urine AMBER (A) YELLOW   APPearance CLOUDY (A) CLEAR   Specific Gravity, Urine 1.032 (H) 1.005 - 1.030   pH 5.0 5.0 - 8.0   Glucose, UA NEGATIVE NEGATIVE mg/dL   Hgb urine dipstick LARGE (A) NEGATIVE   Bilirubin Urine NEGATIVE NEGATIVE   Ketones, ur NEGATIVE NEGATIVE mg/dL   Protein, ur 595 (A) NEGATIVE mg/dL   Nitrite NEGATIVE NEGATIVE   Leukocytes, UA NEGATIVE NEGATIVE   RBC / HPF TOO NUMEROUS TO COUNT 0 - 5 RBC/hpf   WBC, UA TOO NUMEROUS TO COUNT 0 - 5 WBC/hpf   Bacteria, UA NONE SEEN NONE SEEN   Squamous Epithelial / LPF NONE SEEN NONE SEEN   WBC Clumps PRESENT    Mucous PRESENT    Budding Yeast PRESENT    Imaging Studies: Ct Renal Stone Study  Result Date: 05/04/2017 CLINICAL DATA:  Right-sided pain and hematuria. EXAM: CT ABDOMEN AND PELVIS WITHOUT CONTRAST TECHNIQUE:  Multidetector CT imaging of the abdomen and pelvis was performed following the standard protocol without IV contrast. COMPARISON:  CT 11/14/2011 FINDINGS: Lower chest: Mild dependent atelectasis. No pleural fluid or consolidation. Hepatobiliary: No focal liver abnormality is seen. Mild decreased density consistent with steatosis. No gallstones, gallbladder wall thickening, or biliary dilatation. Pancreas: No ductal dilatation or inflammation. Spleen: Upper normal in size spanning 13 cm. Adrenals/Urinary  Tract: Bilateral adrenal calcifications, unchanged from prior exam. Punctate 2 mm stone in the distal right ureter approximately 5.8 cm from the ureterovesicular junction with mild proximal hydroureteronephrosis. Mild perinephric edema. No additional nonobstructing calculi in a day kidney. There is no left hydronephrosis. Urinary bladder is nondistended.  No bladder stone. Stomach/Bowel: Stomach physiologically distended. No bowel inflammation, obstruction or wall thickening. Normal appendix. Small to moderate colonic stool burden. Vascular/Lymphatic: No enlarged abdominal or pelvic lymph nodes. Abdominal aorta is normal in caliber. There is trace atherosclerosis. Reproductive: Prostate is unremarkable. Other: No free air, free fluid, or intra-abdominal fluid collection. Previous right inguinal hernia is not seen. Musculoskeletal: There are no acute or suspicious osseous abnormalities. IMPRESSION: 1. Punctate obstructing stone in the distal right ureter with mild proximal hydroureteronephrosis. 2. Hepatic steatosis. 3. Bilateral adrenal calcifications are unchanged, may be sequela of prior hemorrhage or infection. Electronically Signed   By: Rubye OaksMelanie  Ehinger M.D.   On: 05/04/2017 02:10    ED COURSE  Nursing notes and initial vitals signs, including pulse oximetry, reviewed.  Vitals:   05/03/17 2246 05/03/17 2247 05/04/17 0104  BP: 132/82  120/90  Pulse: 67  72  Resp: 18  18  Temp: 98.2 F (36.8 C)      TempSrc: Oral    SpO2: 99%  98%  Weight:  72.6 kg (160 lb)   Height:  5\' 10"  (1.778 m)    2:18 AM Urinalysis is not consistent with an infected stone but has been sent for culture as a precaution. Pain is well controlled this time.  PROCEDURES    ED DIAGNOSES     ICD-10-CM   1. Ureterolithiasis N20.1        Salote Weidmann, MD 05/04/17 [id number]    Paula LibraMolpus, Lavada Langsam, MD 05/04/17 [phone]

## 2017-05-05 LAB — URINE CULTURE: Culture: NO GROWTH

## 2017-06-21 ENCOUNTER — Emergency Department (HOSPITAL_BASED_OUTPATIENT_CLINIC_OR_DEPARTMENT_OTHER)
Admission: EM | Admit: 2017-06-21 | Discharge: 2017-06-21 | Disposition: A | Discharge status: Home / Self Care | Payer: BLUE CROSS/BLUE SHIELD | Attending: Emergency Medicine | Admitting: Emergency Medicine

## 2017-06-21 ENCOUNTER — Encounter (HOSPITAL_BASED_OUTPATIENT_CLINIC_OR_DEPARTMENT_OTHER): Payer: Self-pay | Admitting: *Deleted

## 2017-06-21 ENCOUNTER — Emergency Department (HOSPITAL_BASED_OUTPATIENT_CLINIC_OR_DEPARTMENT_OTHER): Payer: BLUE CROSS/BLUE SHIELD

## 2017-06-21 DIAGNOSIS — J069 Acute upper respiratory infection, unspecified: Secondary | ICD-10-CM | POA: Diagnosis not present

## 2017-06-21 DIAGNOSIS — R05 Cough: Secondary | ICD-10-CM | POA: Diagnosis present

## 2017-06-21 DIAGNOSIS — F17210 Nicotine dependence, cigarettes, uncomplicated: Secondary | ICD-10-CM | POA: Insufficient documentation

## 2017-06-21 DIAGNOSIS — B9789 Other viral agents as the cause of diseases classified elsewhere: Secondary | ICD-10-CM

## 2017-06-21 MED ORDER — IPRATROPIUM-ALBUTEROL 0.5-2.5 (3) MG/3ML IN SOLN
3.0000 mL | Freq: Once | RESPIRATORY_TRACT | Status: AC
  Administered 2017-06-21: 3 mL via RESPIRATORY_TRACT

## 2017-06-21 MED ORDER — IPRATROPIUM-ALBUTEROL 0.5-2.5 (3) MG/3ML IN SOLN
RESPIRATORY_TRACT | Status: AC
  Administered 2017-06-21: 3 mL via RESPIRATORY_TRACT
  Filled 2017-06-21: qty 3

## 2017-06-21 MED ORDER — ALBUTEROL SULFATE HFA 108 (90 BASE) MCG/ACT IN AERS
1.0000 | INHALATION_SPRAY | Freq: Four times a day (QID) | RESPIRATORY_TRACT | Status: DC | PRN
  Administered 2017-06-21: 2 via RESPIRATORY_TRACT
  Filled 2017-06-21: qty 6.7

## 2017-06-21 MED ORDER — ALBUTEROL SULFATE (2.5 MG/3ML) 0.083% IN NEBU
2.5000 mg | INHALATION_SOLUTION | Freq: Once | RESPIRATORY_TRACT | Status: AC
  Administered 2017-06-21: 2.5 mg via RESPIRATORY_TRACT

## 2017-06-21 MED ORDER — ALBUTEROL SULFATE (2.5 MG/3ML) 0.083% IN NEBU
INHALATION_SOLUTION | RESPIRATORY_TRACT | Status: AC
  Administered 2017-06-21: 2.5 mg via RESPIRATORY_TRACT
  Filled 2017-06-21: qty 3

## 2017-06-21 NOTE — ED Provider Notes (Signed)
[facility]   CSN: [id number] Arrival date & time: 06/21/17  1642     History   Chief Complaint Chief Complaint  Patient presents with  . Cough    HPI Gary Hansen is a 41 y.o. male presenting to the ED with cough and congestion since yesterday. Reports symptoms began with a scratchy throat yesterday afternoon, and he began coughing with congestion later in the evening. He has tried mucinex and dayquil without significant relief. He denies shortness of breath, but states he began wheezing today so he made an appointment with his PCP tomorrow, however did not want to wait until this appointment to be seen. He denies F/C, myalgias, Difficulty swallowing, abdominal pain, nausea/vomiting, or any other complaints today. Denies history of asthma. In triage, patient noted to be wheezing and was given albuterol nebulized treatment.  The history is provided by the patient.    Past Medical History:  Diagnosis Date  . Atypical chest pain 11/09/2015  . Dental crowns present   . Inguinal hernia 04/2012   right  . Tobacco abuse 11/09/2015    Patient Active Problem List   Diagnosis Date Noted  . Atypical chest pain 11/09/2015  . Tobacco abuse 11/09/2015  . Right inguinal hernia 04/15/2012    Past Surgical History:  Procedure Laterality Date  . HERNIA REPAIR  02/2009   left inguinal hernia  . HERNIA REPAIR  05/19/12   Right ing hernia  . INGUINAL HERNIA REPAIR  05/19/2012   Procedure: HERNIA REPAIR INGUINAL ADULT;  Surgeon: Wilmon ArmsMatthew K. Corliss Skainssuei, MD;  Location: [facility];  Service: General;  Laterality: Right;  . WISDOM TOOTH EXTRACTION         Home Medications    Prior to Admission medications   Medication Sig Start Date End Date Taking? Authorizing Provider  albuterol (PROVENTIL HFA;VENTOLIN HFA) 108 (90 Base) MCG/ACT inhaler Inhale 2 puffs into the lungs every 4 (four) hours as needed for wheezing or shortness of breath (cough, shortness of  breath or wheezing.). 12/10/16   Porfirio OarJeffery, Chelle, PA-C  HYDROmorphone (DILAUDID) 2 MG tablet Take 1 tablet (2 mg total) by mouth every 4 (four) hours as needed (for pain). 05/04/17   Molpus, John, MD  ipratropium (ATROVENT) 0.03 % nasal spray Place 2 sprays into both nostrils 2 (two) times daily. 01/29/17   Porfirio OarJeffery, Chelle, PA-C  ondansetron (ZOFRAN ODT) 8 MG disintegrating tablet Take 1 tablet (8 mg total) by mouth every 8 (eight) hours as needed for nausea or vomiting. 05/04/17   Molpus, Jonny RuizJohn, MD    Family History Family History  Problem Relation Age of Onset  . CAD Paternal Grandfather   . Heart failure Paternal Grandmother     Social History Social History  Substance Use Topics  . Smoking status: Current Every Day Smoker    Packs/day: 0.50    Years: 10.00    Types: Cigarettes  . Smokeless tobacco: Never Used  . Alcohol use No     Allergies   Patient has no known allergies.   Review of Systems Review of Systems  Constitutional: Negative for chills and fever.  HENT: Positive for congestion and sore throat. Negative for trouble swallowing and voice change.   Respiratory: Positive for cough, chest tightness and wheezing. Negative for shortness of breath.   Cardiovascular: Negative for chest pain.  Gastrointestinal: Negative for abdominal pain, nausea and vomiting.  All other systems reviewed and are negative.    Physical Exam Updated Vital Signs BP  134/83 (BP Location: Right Arm)   Pulse 84   Temp 98.4 F (36.9 C) (Oral)   Resp 20   Ht  (1.778 m)   Wt 74.8 kg (165 lb)   SpO2 100%   BMI 23.68 kg/m   Physical Exam  Constitutional: He appears well-developed and well-nourished. No distress.  HENT:  Head: Normocephalic and atraumatic.  Right Ear: Hearing, tympanic membrane, external ear and ear canal normal.  Left Ear: Hearing, tympanic membrane, external ear and ear canal normal.  Nose: Nose normal.  Mouth/Throat: Uvula is midline. No trismus in the jaw. No  uvula swelling. Posterior oropharyngeal erythema present.  Eyes: Conjunctivae are normal.  Neck: Normal range of motion. Neck supple. No tracheal deviation present.  Cardiovascular: Normal rate, regular rhythm, normal heart sounds and intact distal pulses.  Exam reveals no friction rub.   No murmur heard. Pulmonary/Chest: Effort normal and breath sounds normal. No stridor. No respiratory distress. He has no wheezes. He has no rales.  Abdominal: Soft. Bowel sounds are normal. There is no tenderness.  Lymphadenopathy:    He has no cervical adenopathy.  Neurological: He is alert.  Skin: Skin is warm.  Psychiatric: He has a normal mood and affect. His behavior is normal.  Nursing note and vitals reviewed.    ED Treatments / Results  Labs (all labs ordered are listed, but only abnormal results are displayed) Labs Reviewed - No data to display  EKG  EKG Interpretation None       Radiology Dg Chest 2 View  Result Date: 06/21/2017 CLINICAL DATA:  Patient with cough, congestion shortness of breath. EXAM: CHEST  2 VIEW COMPARISON:  None. FINDINGS: Normal cardiac and mediastinal contours. No consolidative pulmonary opacities. No pleural effusion or pneumothorax. Thoracic spine degenerative changes. IMPRESSION: No acute cardiopulmonary process. Electronically Signed   By: Annia Belt M.D.   On: 06/21/2017 19:35    Procedures Procedures (including critical care time)  Medications Ordered in ED Medications  ipratropium-albuterol (DUONEB) 0.5-2.5 (3) MG/3ML nebulizer solution 3 mL (3 mLs Nebulization Given 06/21/17 1852)  albuterol (PROVENTIL) (2.5 MG/3ML) 0.083% nebulizer solution 2.5 mg (2.5 mg Nebulization Given 06/21/17 1852)     Initial Impression / Assessment and Plan / ED Course  I have reviewed the triage vital signs and the nursing notes.  Pertinent labs & imaging results that were available during my care of the patient were reviewed by me and considered in my medical decision  making (see chart for details).     Pt w cough and congestion x 1 day. Pt wheezing in triage without increased work of breathing and given albuterol treatment. On my exam, lungs CTAB, no inc work of breathing, O2 sat 98%. CXR negative for acute infiltrate. Patients symptoms are consistent with URI, likely viral etiology. Discussed that antibiotics are not indicated for viral infections. Pt will be discharged with symptomatic treatment, provided albuterol inhaler. Encouraged patient attend his PCP appointment tomorrow for follow up and to discuss respiratory symptoms. Verbalizes understanding and is agreeable with plan. Pt is hemodynamically stable & in NAD prior to dc.  Discussed results, findings, treatment and follow up. Patient advised of return precautions. Patient verbalized understanding and agreed with plan.  Final Clinical Impressions(s) / ED Diagnoses   Final diagnoses:  Viral URI with cough    New Prescriptions New Prescriptions   No medications on file     Russo, [place] N, PA-C 06/22/17 0036    Loren Racer, MD 06/23/17 [phone]

## 2017-06-21 NOTE — ED Triage Notes (Signed)
Cough and nasal congestion since yesterday. He has been taking OTC meds with no relief.

## 2017-06-21 NOTE — Discharge Instructions (Signed)
Please read instructions below.  You can take tylenol as needed for sore throat or body aches.  Use saline nasal spray for congestion. Use the inhaler every 6 hours as needed for wheezing. Drink plenty of water.  Follow up with your primary care provider in 3 days. Return to the ER for difficulty swallowing liquids, difficulty breathing, or new or worsening symptoms.

## 2017-06-22 ENCOUNTER — Ambulatory Visit: Payer: BLUE CROSS/BLUE SHIELD | Admitting: Physician Assistant

## 2017-10-12 ENCOUNTER — Ambulatory Visit: Payer: BLUE CROSS/BLUE SHIELD | Admitting: Physician Assistant

## 2017-10-12 ENCOUNTER — Other Ambulatory Visit: Payer: Self-pay

## 2017-10-12 ENCOUNTER — Encounter: Payer: Self-pay | Admitting: Physician Assistant

## 2017-10-12 VITALS — BP 112/70 | HR 65 | Temp 97.6°F | Resp 18 | Ht 70.0 in | Wt 168.2 lb

## 2017-10-12 DIAGNOSIS — Z23 Encounter for immunization: Secondary | ICD-10-CM | POA: Diagnosis not present

## 2017-10-12 DIAGNOSIS — H538 Other visual disturbances: Secondary | ICD-10-CM

## 2017-10-12 LAB — POCT CBC
Granulocyte percent: 51.4 %G (ref 37–80)
HEMATOCRIT: 46.1 % (ref 43.5–53.7)
Hemoglobin: 15.3 g/dL (ref 14.1–18.1)
Lymph, poc: 2.4 (ref 0.6–3.4)
MCH: 28.5 pg (ref 27–31.2)
MCHC: 33.2 g/dL (ref 31.8–35.4)
MCV: 85.7 fL (ref 80–97)
MID (CBC): 0.5 (ref 0–0.9)
MPV: 7.9 fL (ref 0–99.8)
POC GRANULOCYTE: 3.1 (ref 2–6.9)
POC LYMPH PERCENT: 39.8 %L (ref 10–50)
POC MID %: 8.8 % (ref 0–12)
Platelet Count, POC: 305 10*3/uL (ref 142–424)
RBC: 5.38 M/uL (ref 4.69–6.13)
RDW, POC: 12 %
WBC: 6.1 10*3/uL (ref 4.6–10.2)

## 2017-10-12 LAB — POCT URINALYSIS DIP (MANUAL ENTRY)
BILIRUBIN UA: NEGATIVE
Blood, UA: NEGATIVE
GLUCOSE UA: NEGATIVE mg/dL
Ketones, POC UA: NEGATIVE mg/dL
Leukocytes, UA: NEGATIVE
Nitrite, UA: NEGATIVE
Protein Ur, POC: NEGATIVE mg/dL
Urobilinogen, UA: 0.2 E.U./dL
pH, UA: 5.5 (ref 5.0–8.0)

## 2017-10-12 LAB — POC MICROSCOPIC URINALYSIS (UMFC): MUCUS RE: ABSENT

## 2017-10-12 LAB — GLUCOSE, POCT (MANUAL RESULT ENTRY): POC Glucose: 89 mg/dl (ref 70–99)

## 2017-10-12 NOTE — Progress Notes (Signed)
Patient ID: Gary Hansen, male    DOB: [birth year], 41 y.o.   MRN: [id number]  PCP: Patient, No Pcp Per  Chief Complaint  Patient presents with  . Blurred Vision    having blurry spots in vision pt states it comes and goes x2days and also happened a few months ago     Subjective:   Presents for evaluation of blurry vision.  He had one episode of blurred vision, lasting about 40 minutes, 6 or 7 months ago.  There were no other associated symptoms and he was not concerned when the symptoms did not recur.  3 days ago he had another episode, also lasting approximately 40 minutes, and also resolving without treatment.  He has had another episode each day since then.  Each episode starts with blurriness of his peripheral vision and then wavy lines obscuring his central vision.  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  He describes the sensation that there is water in his eyes but he denies any kind of drainage.  No pain or itching.  No associated headache or nausea. He has experienced some photophobia, thinks that bright lights may be a trigger.  New line no personal or family history of migraine headache. He does have episodic sinus congestion that is often associated with pain behind 1 of his eyes.  He has not had sinus congestion with his current symptoms.  No associated numbness or tingling in the face or extremities. No weakness. No slurred speech, confusion, facial droop. No change in sleep, activity or diet as above.   No urinary symptoms. No diarrhea or constipation. No new or unexplained muscle or joint pain. No rash.     Review of Systems     Patient Active Problem List   Diagnosis Date Noted  . Atypical chest pain 11/09/2015  . Tobacco abuse 11/09/2015  . Right inguinal hernia 04/15/2012     No current medications.   No Known Allergies     Objective:  Physical Exam  Constitutional: He is oriented to  person, place, and time. He appears well-developed and well-nourished. He is active and cooperative. No distress.  BP 112/70   Pulse 65   Temp 97.6 F (36.4 C) (Oral)   Resp 18   Ht 5\' 10"  (1.778 m)   Wt 168 lb 3.2 oz (76.3 kg)   SpO2 98%   BMI 24.13 kg/m   HENT:  Head: Normocephalic and atraumatic.  Right Ear: Hearing, tympanic membrane, external ear and ear canal normal.  Left Ear: Hearing, tympanic membrane, external ear and ear canal normal.  Nose: Nose normal. Right sinus exhibits no maxillary sinus tenderness and no frontal sinus tenderness. Left sinus exhibits no maxillary sinus tenderness and no frontal sinus tenderness.  Mouth/Throat: Uvula is midline, oropharynx is clear and moist and mucous membranes are normal. No oropharyngeal exudate.  Eyes: Conjunctivae, EOM and lids are normal. Pupils are equal, round, and reactive to light. No scleral icterus.  Fundoscopic exam:      The right eye shows no hemorrhage and no papilledema. The right eye shows red reflex.       The left eye shows no hemorrhage and no papilledema. The left eye shows red reflex.  Neck: Normal range of motion. Neck supple. No thyromegaly present.  Cardiovascular: Normal rate, regular rhythm and normal heart sounds.  Pulses:      Radial pulses are 2+ on the right side, and  2+ on the left side.  Pulmonary/Chest: Effort normal and breath sounds normal.  Lymphadenopathy:       Head (right side): No tonsillar, no preauricular, no posterior auricular and no occipital adenopathy present.       Head (left side): No tonsillar, no preauricular, no posterior auricular and no occipital adenopathy present.    He has no cervical adenopathy.       Right: No supraclavicular adenopathy present.       Left: No supraclavicular adenopathy present.  Neurological: He is alert and oriented to person, place, and time. He has normal strength. No cranial nerve deficit or sensory deficit.  Reflex Scores:      Bicep reflexes are 2+  on the right side and 2+ on the left side.      Patellar reflexes are 2+ on the right side and 2+ on the left side. Skin: Skin is warm, dry and intact. No rash noted. No cyanosis or erythema. Nails show no clubbing.  Psychiatric: He has a normal mood and affect. His speech is normal and behavior is normal.        Visual Acuity Screening   Right eye Left eye Both eyes  Without correction:     With correction: 20/15 20/20 20/15    Results for orders placed or performed in visit on 10/12/17  POCT CBC  Result Value Ref Range   WBC 6.1 4.6 - 10.2 K/uL   Lymph, poc 2.4 0.6 - 3.4   POC LYMPH PERCENT 39.8 10 - 50 %L   MID (cbc) 0.5 0 - 0.9   POC MID % 8.8 0 - 12 %M   POC Granulocyte 3.1 2 - 6.9   Granulocyte percent 51.4 37 - 80 %G   RBC 5.38 4.69 - 6.13 M/uL   Hemoglobin 15.3 14.1 - 18.1 g/dL   HCT, POC 16.146.1 09.643.5 - 53.7 %   MCV 85.7 80 - 97 fL   MCH, POC 28.5 27 - 31.2 pg   MCHC 33.2 31.8 - 35.4 g/dL   RDW, POC 04.512.0 %   Platelet Count, POC 305 142 - 424 K/uL   MPV 7.9 0 - 99.8 fL  POCT glucose (manual entry)  Result Value Ref Range   POC Glucose 89 70 - 99 mg/dl  POCT urinalysis dipstick  Result Value Ref Range   Color, UA yellow yellow   Clarity, UA clear clear   Glucose, UA negative negative mg/dL   Bilirubin, UA negative negative   Ketones, POC UA negative negative mg/dL   Spec Grav, UA >=4.098>=1.030 (A) 1.010 - 1.025   Blood, UA negative negative   pH, UA 5.5 5.0 - 8.0   Protein Ur, POC negative negative mg/dL   Urobilinogen, UA 0.2 0.2 or 1.0 E.U./dL   Nitrite, UA Negative Negative   Leukocytes, UA Negative Negative  POCT Microscopic Urinalysis ([facility])  Result Value Ref Range   WBC,UR,HPF,POC None None WBC/hpf   RBC,UR,HPF,POC None None RBC/hpf   Bacteria None None, Too numerous to count   Mucus Absent Absent   Epithelial Cells, UR Per Microscopy None None, Too numerous to count cells/hpf       Assessment & Plan:   1. Blurry vision, bilateral Unknown etiology.   Encourage increased oral hydration.  Schedule with an eye specialist for evaluation in the next week, anticipate it will be normal.  He will let me know if that is indeed the case, I will refer to neurology for additional evaluation and treatment. - POCT  CBC - POCT glucose (manual entry) - POCT urinalysis dipstick - POCT Microscopic Urinalysis ([facility])  2. Need for influenza vaccination - Flu Vaccine QUAD 36+ mos IM    Return if symptoms worsen or fail to improve.   Fernande Brashelle S. Diamonte Stavely, PA-C [facility] at [facility]

## 2017-10-12 NOTE — Patient Instructions (Addendum)
Please call to schedule an eye examination. If there is no problem there, please let me know, so that I can refer you to a neurologist.  [facility]  [address], [place], [place] [address]  Phone: [phone]  [facility] [address], [place], [place] [address]  Phone: [phone]    IF you received an x-ray today, you will receive an invoice from [facility]. Please contact [facility] at [phone] with questions or concerns regarding your invoice.   IF you received labwork today, you will receive an invoice from [facility]. Please contact [facility] at [phone] with questions or concerns regarding your invoice.   Our billing staff will not be able to assist you with questions regarding bills from these companies.  You will be contacted with the lab results as soon as they are available. The fastest way to get your results is to activate your My Chart account. Instructions are located on the last page of this paperwork. If you have not heard from us regarding the results in 2 weeks, please contact this office.

## 2018-01-13 ENCOUNTER — Encounter: Payer: Self-pay | Admitting: Physician Assistant

## 2020-04-21 ENCOUNTER — Encounter: Payer: Self-pay | Admitting: Emergency Medicine

## 2020-04-21 ENCOUNTER — Ambulatory Visit
Admission: EM | Admit: 2020-04-21 | Discharge: 2020-04-21 | Disposition: A | Discharge status: Home / Self Care | Payer: BC Managed Care – PPO | Attending: Emergency Medicine | Admitting: Emergency Medicine

## 2020-04-21 DIAGNOSIS — H5789 Other specified disorders of eye and adnexa: Secondary | ICD-10-CM

## 2020-04-21 MED ORDER — POLYMYXIN B-TRIMETHOPRIM 10000-0.1 UNIT/ML-% OP SOLN: 1.0000 [drp] | OPHTHALMIC | 0 refills | Status: AC

## 2020-04-21 NOTE — ED Provider Notes (Signed)
[facility]    CSN: [id number] Arrival date & time: 04/21/20  1935      History   Chief Complaint Chief Complaint  Patient presents with  . Eye Pain    HPI Gary Hansen is a 44 y.o. male with history of tobacco use presenting for right eye irritation and redness.  States this occurred earlier today when debris got into his eye at work.  Patient denies visual changes, photophobia, painful eye movements.  Does not wear glasses or contact lenses    Past Medical History:  Diagnosis Date  . Atypical chest pain 11/09/2015  . Dental crowns present   . Inguinal hernia 04/2012   right  . Tobacco abuse 11/09/2015    Patient Active Problem List   Diagnosis Date Noted  . Atypical chest pain 11/09/2015  . Tobacco abuse 11/09/2015  . Right inguinal hernia 04/15/2012    Past Surgical History:  Procedure Laterality Date  . HERNIA REPAIR  02/2009   left inguinal hernia  . HERNIA REPAIR  05/19/12   Right ing hernia  . INGUINAL HERNIA REPAIR  05/19/2012   Procedure: HERNIA REPAIR INGUINAL ADULT;  Surgeon: Wilmon Arms. Corliss Skains, MD;  [facility]:  [facility];  Service: General;  Laterality: Right;  . WISDOM TOOTH EXTRACTION         Home Medications    Prior to Admission medications   Medication Sig Start Date End Date Taking? Authorizing Provider  trimethoprim-polymyxin b (POLYTRIM) ophthalmic solution Place 1 drop into the left eye every 4 (four) hours. 04/21/20   Hall-Potvin, [place], PA-C  albuterol (PROVENTIL HFA;VENTOLIN HFA) 108 (90 Base) MCG/ACT inhaler Inhale 2 puffs into the lungs every 4 (four) hours as needed for wheezing or shortness of breath (cough, shortness of breath or wheezing.). Patient not taking: Reported on 10/12/2017 12/10/16 04/21/20  Porfirio Oar, PA  ipratropium (ATROVENT) 0.03 % nasal spray Place 2 sprays into both nostrils 2 (two) times daily. Patient not taking: Reported on 10/12/2017 01/29/17 04/21/20  Porfirio Oar, PA    Family  History Family History  Problem Relation Age of Onset  . CAD Paternal Grandfather   . Heart failure Paternal Grandmother     Social History Social History   Tobacco Use  . Smoking status: Current Every Day Smoker    Packs/day: 0.50    Years: 10.00    Pack years: 5.00    Types: Cigarettes  . Smokeless tobacco: Never Used  Substance Use Topics  . Alcohol use: No  . Drug use: No     Allergies   Patient has no known allergies.   Review of Systems As per HPI   Physical Exam Triage Vital Signs ED Triage Vitals  Enc Vitals Group     BP      Pulse      Resp      Temp      Temp src      SpO2      Weight      Height      Head Circumference      Peak Flow      Pain Score      Pain Loc      Pain Edu?      Excl. in GC?    No data found.  Updated Vital Signs BP 126/83 (BP Location: Left Arm)   Pulse 74   Temp 98.2 F (36.8 C) (Oral)   Resp 18   SpO2 96%   Visual Acuity  Right Eye Distance:   Left Eye Distance:   Bilateral Distance:    Right Eye Near:   Left Eye Near:    Bilateral Near:     Physical Exam Constitutional:      General: He is not in acute distress. HENT:     Head: Normocephalic and atraumatic.  Eyes:     General: No scleral icterus.       Right eye: No discharge.        Left eye: No discharge.     Extraocular Movements: Extraocular movements intact.     Pupils: Pupils are equal, round, and reactive to light.     Comments: Right eye conjunctiva injected as compared to left.  Scant debris which was successfully irrigated in office.  Fluorescein dye exam unremarkable.  Cardiovascular:     Rate and Rhythm: Normal rate.  Pulmonary:     Effort: Pulmonary effort is normal. No respiratory distress.     Breath sounds: No wheezing.  Skin:    Coloration: Skin is not jaundiced or pale.  Neurological:     Mental Status: He is alert and oriented to person, place, and time.      UC Treatments / Results  Labs (all labs ordered are listed,  but only abnormal results are displayed) Labs Reviewed - No data to display  EKG   Radiology No results found.  Procedures Procedures (including critical care time)  Medications Ordered in UC Medications - No data to display  Initial Impression / Assessment and Plan / UC Course  I have reviewed the triage vital signs and the nursing notes.  Pertinent labs & imaging results that were available during my care of the patient were reviewed by me and considered in my medical decision making (see chart for details).     Patient without visual changes, appears well in office.  Debris successfully irrigated with normal saline.  Fluorescein dye exam unremarkable.  Given prolonged irritation and rubbing of eyes throat today, will provide antibiotic eyedrops.  Return precautions discussed, patient verbalized understanding and is agreeable to plan. Final Clinical Impressions(s) / UC Diagnoses   Final diagnoses:  Irritation of right eye     Discharge Instructions     Use eyedrops as directed on eye(s) as prescribed.  May use artificial tear gel/drops at needed. Important to use artificial tear gel/drops last and wait 10-15 minutes between drops as it can prevent your prescription drops from working properly.  Important to follow up with Ophthalmology (eye doctor). Return sooner for worsening of symptoms, change in vision, sensitivity to light, eye swelling, painful eye movement, or fever.     ED Prescriptions    Medication Sig Dispense Auth. Provider   trimethoprim-polymyxin b (POLYTRIM) ophthalmic solution Place 1 drop into the left eye every 4 (four) hours. 10 mL Hall-Potvin, [place], PA-C     PDMP not reviewed this encounter.   Hall-Potvin, [place], [place] 04/21/20 2003

## 2020-04-21 NOTE — Discharge Instructions (Signed)
Use eyedrops as directed on eye(s) as prescribed.  May use artificial tear gel/drops at needed. °Important to use artificial tear gel/drops last and wait 10-15 minutes between drops as it can prevent your prescription drops from working properly.  °Important to follow up with Ophthalmology (eye doctor). °Return sooner for worsening of symptoms, change in vision, sensitivity to light, eye swelling, painful eye movement, or fever.  °

## 2020-04-21 NOTE — ED Triage Notes (Signed)
Pt states has something in his rt eye x2 days, redness and watery noted. Denies vision changes.

## 2020-06-10 ENCOUNTER — Ambulatory Visit: Payer: Self-pay

## 2020-06-10 ENCOUNTER — Other Ambulatory Visit: Payer: Self-pay

## 2020-06-10 ENCOUNTER — Other Ambulatory Visit: Payer: Self-pay | Admitting: Family Medicine

## 2020-06-10 DIAGNOSIS — M25572 Pain in left ankle and joints of left foot: Secondary | ICD-10-CM

## 2020-10-27 IMAGING — DX DG ANKLE COMPLETE 3+V*L*
3 series · 3 of 3 positions shown · non-contrast
Comparison: None.

CLINICAL DATA: Rolled left ankle today; pain lateral left ankle/Ludmila

EXAM:
LEFT ANKLE COMPLETE - 3+ VIEW

[ankle ap]
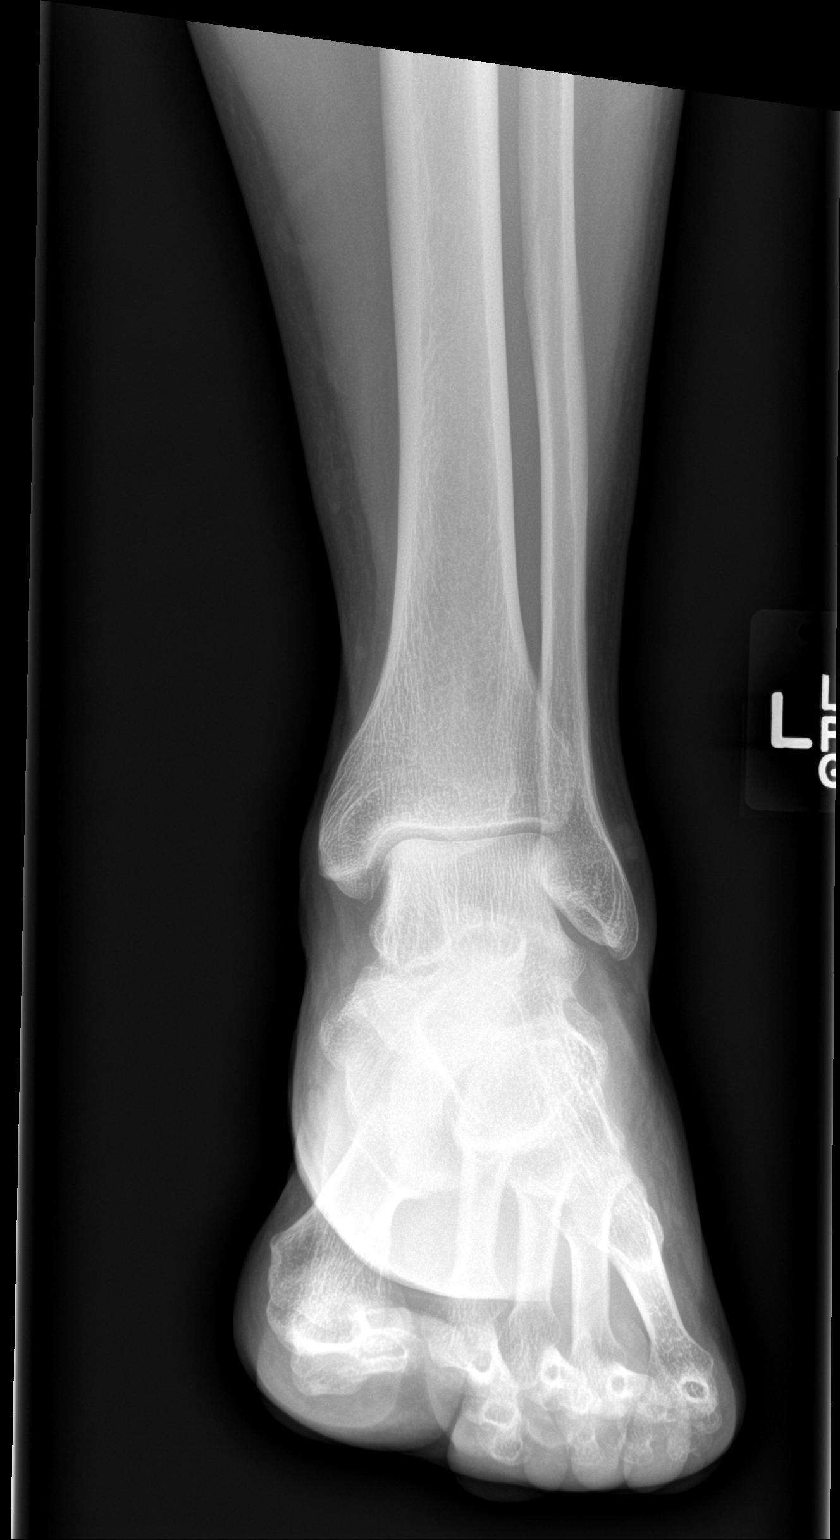

[ankle mortise]
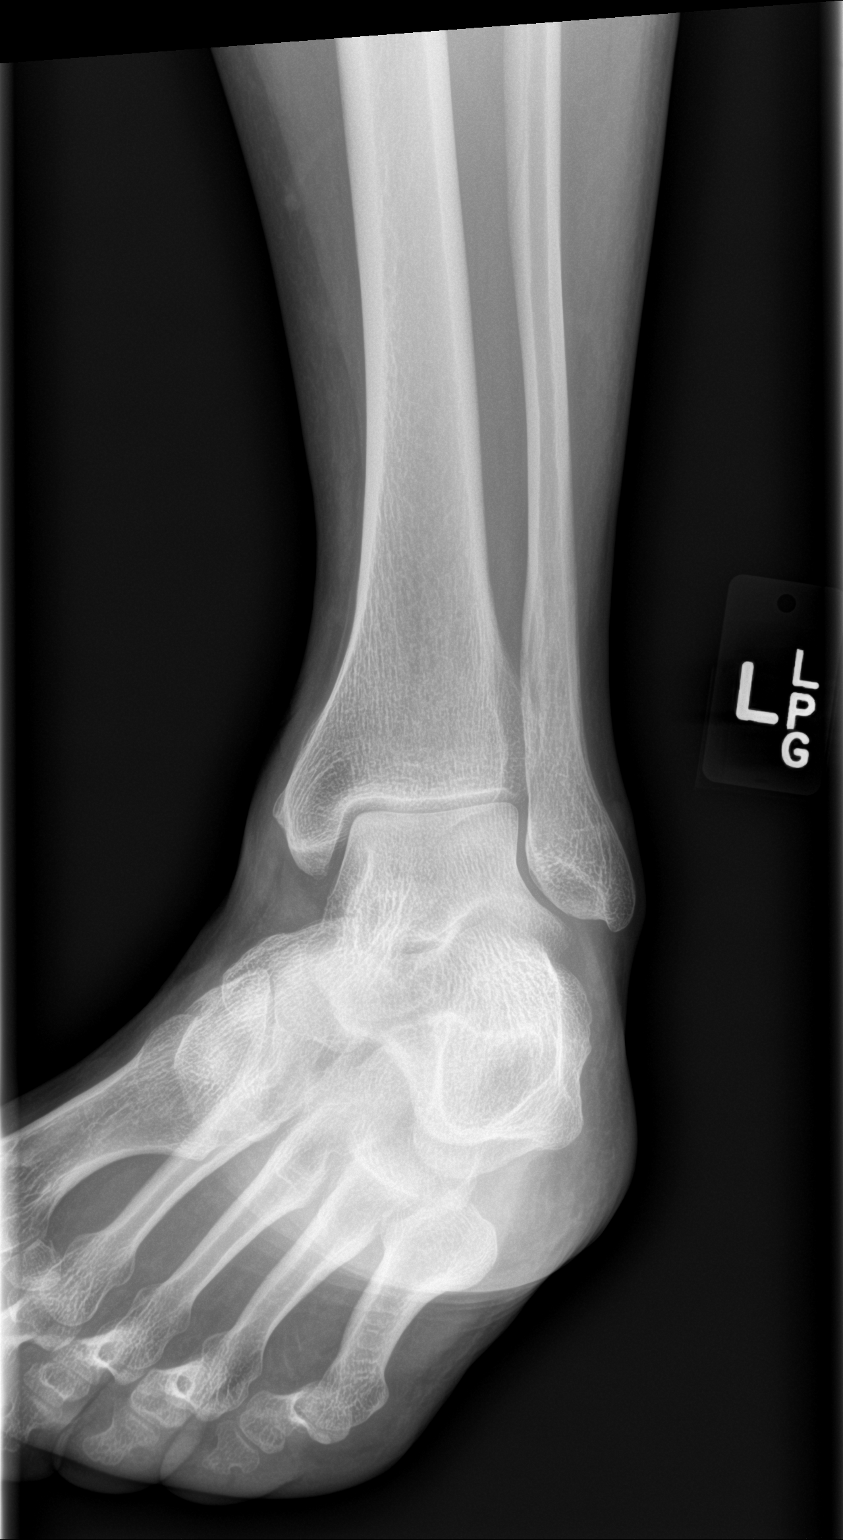

[ankle lat]
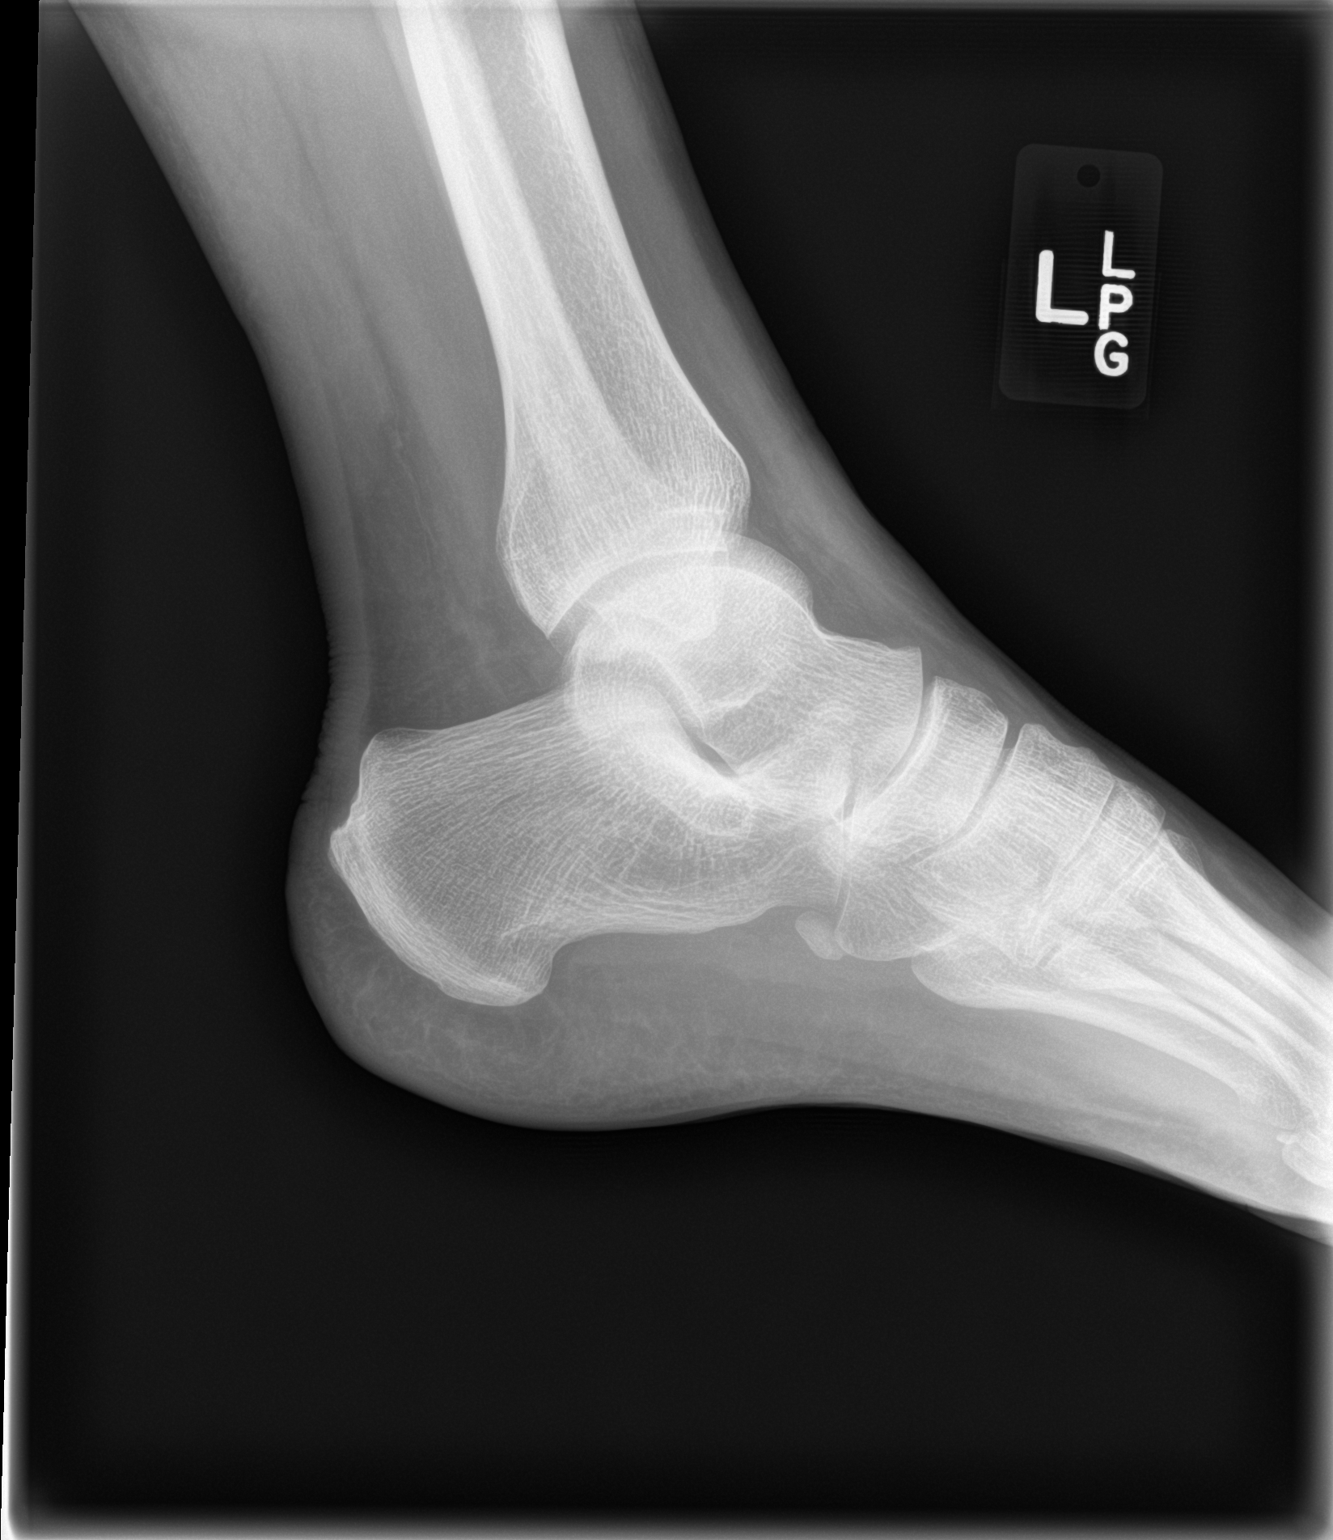

[3 of 3 positions shown; findings below may reference images not displayed]

FINDINGS: There is no evidence of fracture, dislocation, or joint effusion.
There is no evidence of arthropathy or other focal bone abnormality.
Soft tissues are unremarkable.
IMPRESSION: Negative radiographs of the left ankle.

## 2021-06-29 ENCOUNTER — Other Ambulatory Visit: Payer: Self-pay | Admitting: Urgent Care

## 2021-06-29 DIAGNOSIS — J329 Chronic sinusitis, unspecified: Secondary | ICD-10-CM

## 2021-07-17 ENCOUNTER — Ambulatory Visit
Admission: RE | Admit: 2021-07-17 | Discharge: 2021-07-17 | Disposition: A | Discharge status: Home / Self Care | Payer: BC Managed Care – PPO | Source: Ambulatory Visit | Attending: Urgent Care | Admitting: Urgent Care

## 2021-07-17 DIAGNOSIS — J329 Chronic sinusitis, unspecified: Secondary | ICD-10-CM

## 2021-12-03 IMAGING — CT CT MAXILLOFACIAL W/O CM
3 of 5 series · 14 of 47 positions shown, 16 images · non-contrast
Comparison: None.

CLINICAL DATA: Chronic sinusitis.

EXAM:
CT MAXILLOFACIAL WITHOUT CONTRAST
TECHNIQUE: Multidetector CT images of the paranasal sinuses were obtained using
the standard protocol without intravenous contrast.

[Series 4: sinus 2.00 hr60 s3 cor · coronal · 0.27mm/px · 3 of 93 slices shown]
[im 31/93  bone]
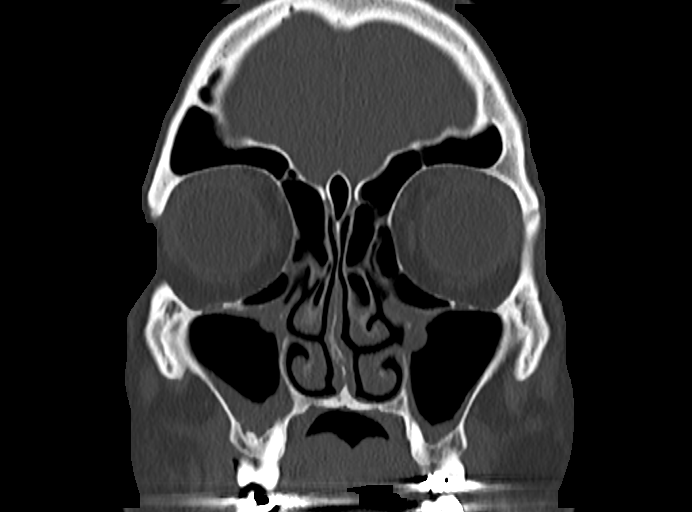
[im 41/93  bone]
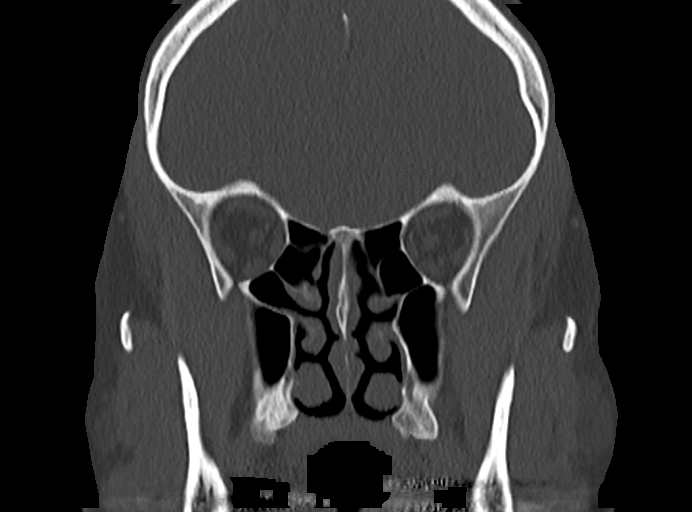
[im 52/93  bone]
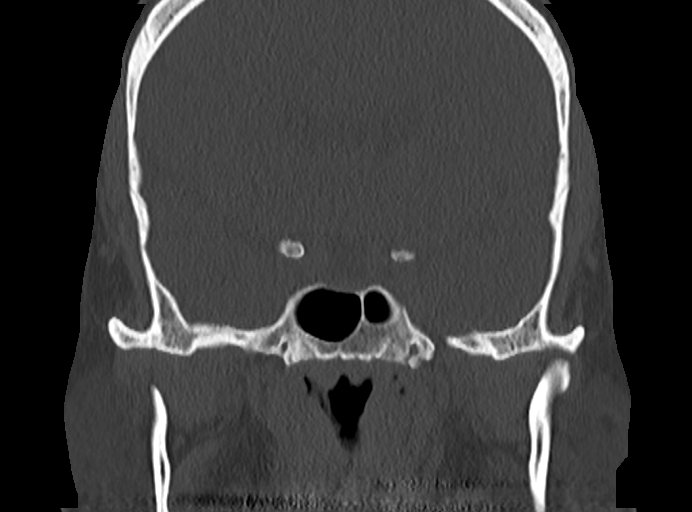

[Series 6: sinus 2.00 hr60 s3 sag · sagittal · 0.27mm/px · 3 of 93 slices shown]
[im 31/93  bone]
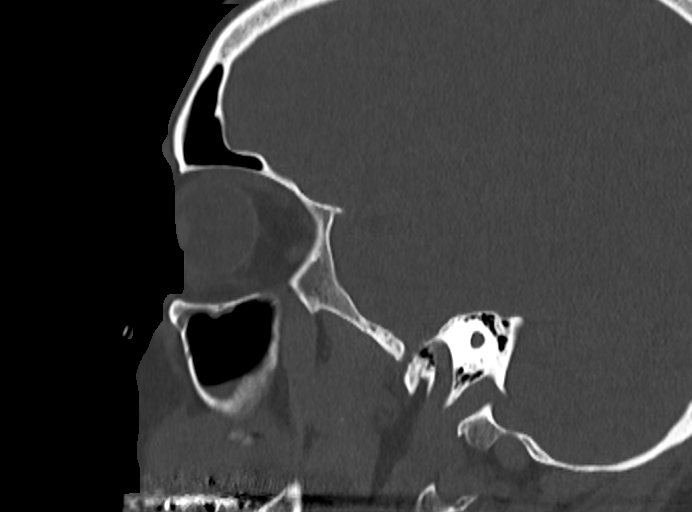
[im 47/93  bone]
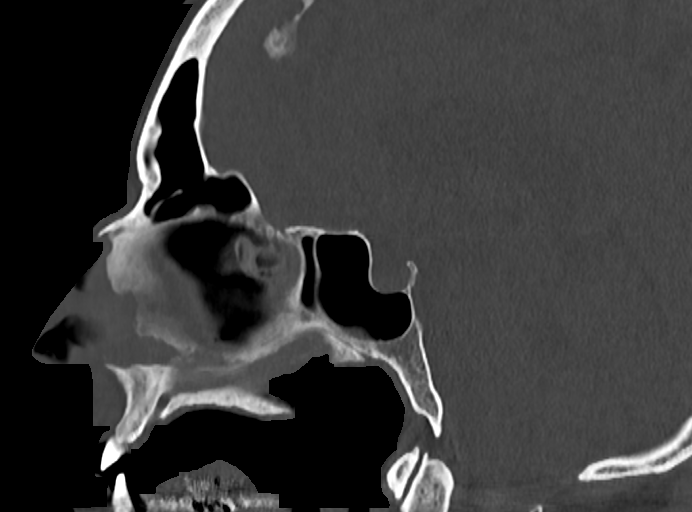
[im 62/93  bone]
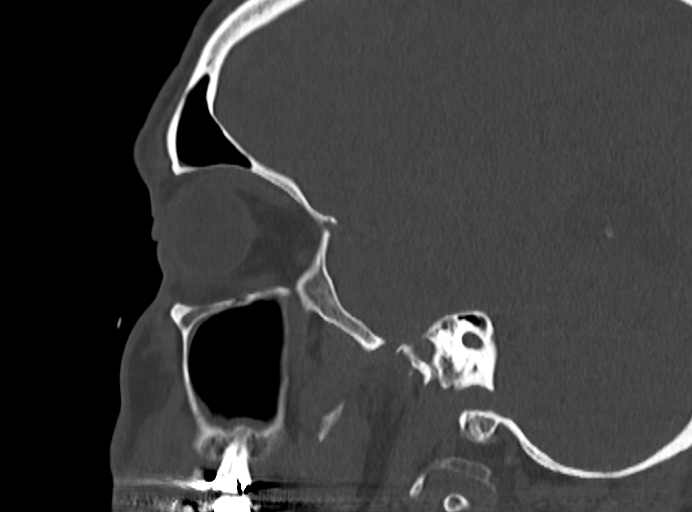

[Series 10: sinus 0.60 br60 s3 thins · axial · 0.37mm/px · z∈[-646,-538]mm · 8 of 231 slices shown, 10 images]
[im 26/231  brain]
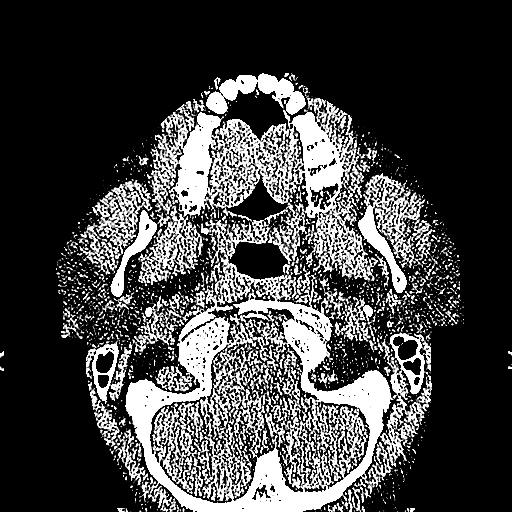
[im 26/231  bone]
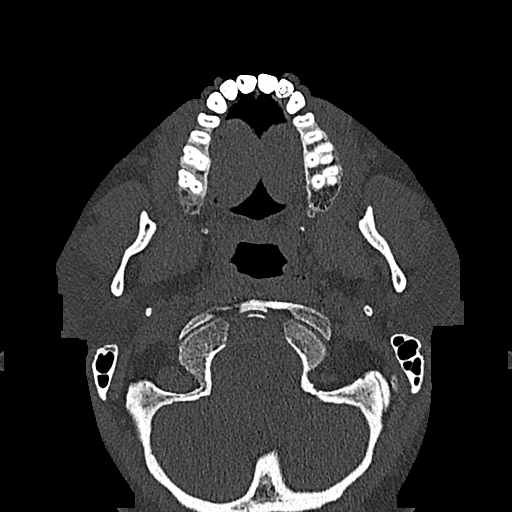
[im 52/231  bone]
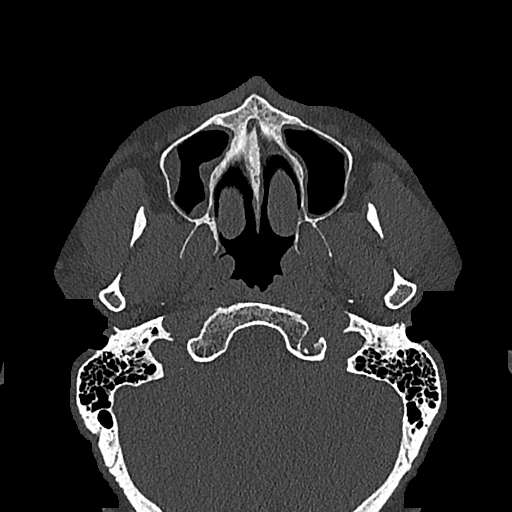
[im 77/231  bone]
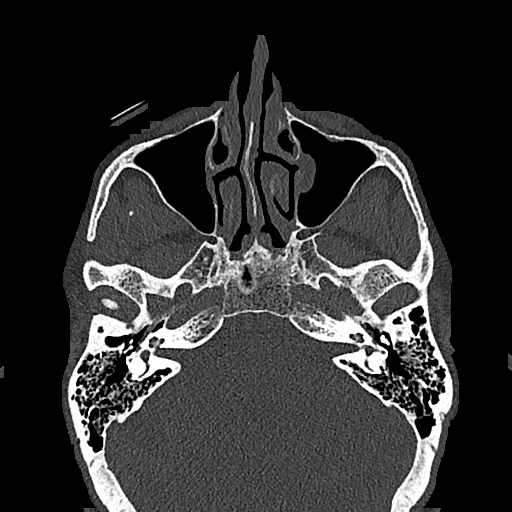
[im 103/231  bone]
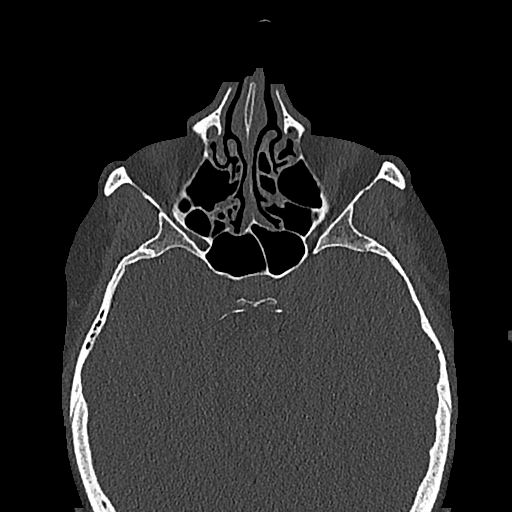
[im 128/231  brain]
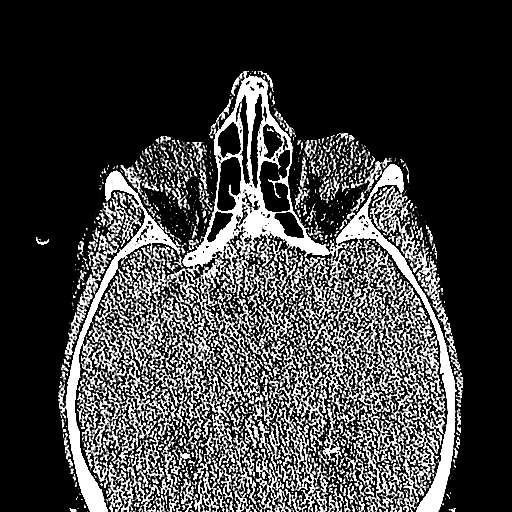
[im 128/231  bone]
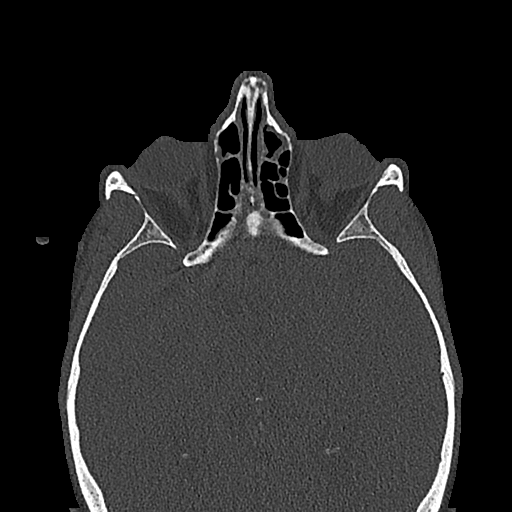
[im 154/231  bone]
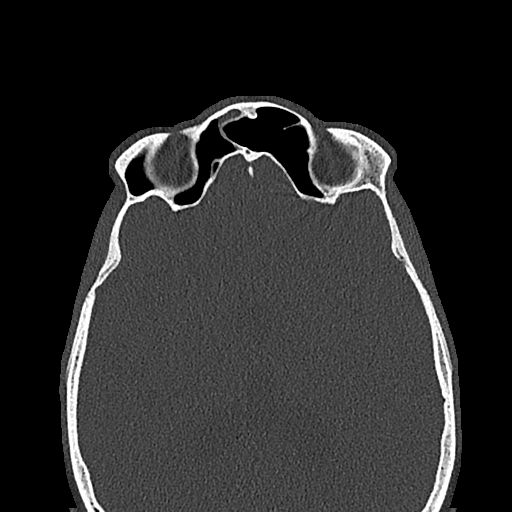
[im 179/231  bone]
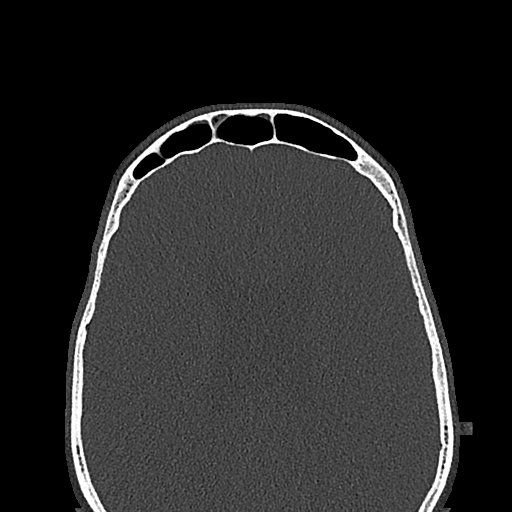
[im 205/231  bone]
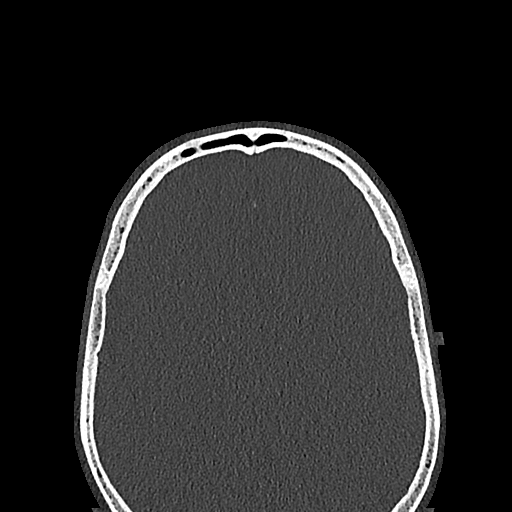

[14 of 47 positions shown; findings below may reference images not displayed]

FINDINGS: Paranasal sinuses:

Frontal: Minimal mucosal thickening is present in the inferior right
frontal sinus. Frontal sinus and frontal drainage pathways are
otherwise clear.

Ethmoid: Normally aerated.

Maxillary: Mild mucosal thickening is present in the inferior
maxillary sinuses bilaterally.

Sphenoid: Normally aerated. Patent sphenoethmoidal recesses.

Right ostiomeatal unit: Mucosal thickening narrows the right
ostiomeatal unit. A prominent Haller cell is present.

Left ostiomeatal unit: Obstructed. Prominent Haller cell is present.

Nasal passages: Patent. Intact nasal septum is midline.

Anatomy: There is pneumatization superior to the left anterior
ethmoid notch. Symmetric and intact olfactory grooves and fovea
ethmoidalis, Keros II (4-7mm). Sellar sphenoid pneumatization
pattern. No dehiscence of carotid or optic canals. No onodi cell.

Other: Orbits and intracranial compartment are unremarkable. Visible
mastoid air cells are normally aerated.
IMPRESSION: 1. Mild mucosal thickening in the inferior right frontal sinus and
bilateral maxillary sinuses.
2. Mucosal thickening narrows the right ostiomeatal unit.
3. Obstructed left ostiomeatal unit.
4. Prominent Haller cells bilaterally.

## 2022-06-26 ENCOUNTER — Ambulatory Visit (INDEPENDENT_AMBULATORY_CARE_PROVIDER_SITE_OTHER): Payer: BC Managed Care – PPO

## 2022-06-26 ENCOUNTER — Ambulatory Visit (INDEPENDENT_AMBULATORY_CARE_PROVIDER_SITE_OTHER): Payer: BC Managed Care – PPO | Admitting: Podiatry

## 2022-06-26 DIAGNOSIS — M7672 Peroneal tendinitis, left leg: Secondary | ICD-10-CM | POA: Diagnosis not present

## 2022-06-26 DIAGNOSIS — M779 Enthesopathy, unspecified: Secondary | ICD-10-CM

## 2022-06-26 DIAGNOSIS — M722 Plantar fascial fibromatosis: Secondary | ICD-10-CM | POA: Diagnosis not present

## 2022-06-26 MED ORDER — MELOXICAM 15 MG PO TABS: 15.0000 mg | ORAL_TABLET | Freq: Every day | ORAL | 1 refills | Status: AC

## 2022-06-26 NOTE — Patient Instructions (Signed)

## 2022-06-26 NOTE — Progress Notes (Signed)
  Subjective:  Patient ID: Gary Hansen, male    DOB: 1976/04/14,  MRN: [id number]  Chief Complaint  Patient presents with   Foot Pain    Pain to the arch radiates to the lateral aspect of the left foot and left ankle. Started about 2-3 weeks ago Throbbing pain. Worst throughout the day. No injuries noted.     46 y.o. male presents with the above complaint. History confirmed with patient.  Has been off work for last couple days so it is feeling much better.  He wears orthotic inserts in his shoes  Objective:  Physical Exam: warm, good capillary refill, no trophic changes or ulcerative lesions, normal DP and PT pulses, normal sensory exam, and mild tenderness in the mid arch, no severe pain on palpation of the medial plantar heel, no pain with resisted inversion eversion plantarflexion dorsiflexion, he has 5 out of 5 strength.   Radiographs: Multiple views x-ray of the left foot: no fracture, dislocation, swelling or degenerative changes noted Assessment:   1. Plantar fasciitis   2. Peroneal tendinitis, left      Plan:  Patient was evaluated and treated and all questions answered.  Discussed the etiology and treatment options for plantar fasciitis, I discussed with him that I suspect he likely was developing Planter fasciitis and began to overload the lateral column and peroneal tendons and compensatory manner.  I recommended home physical therapy for this and gave him exercises seems to be resolving at this point.  I sent him an Rx for meloxicam for anti-inflammatory relief and discussed icing the foot as needed when it is quite painful.  Discussed with him that if it is worsening at any point he can return for further follow-up and injection may be indicated at that time      Return if symptoms worsen or fail to improve.
